# Patient Record
Sex: Female | Born: 1960 | Race: Black or African American | Hispanic: No | Smoking: Never smoker
Health system: Southern US, Community
[De-identification: ages and names within clinical notes are randomized; demographics above are authoritative.]

## PROBLEM LIST (undated history)

## (undated) DIAGNOSIS — D219 Benign neoplasm of connective and other soft tissue, unspecified: Secondary | ICD-10-CM

## (undated) DIAGNOSIS — D649 Anemia, unspecified: Secondary | ICD-10-CM

## (undated) DIAGNOSIS — I10 Essential (primary) hypertension: Secondary | ICD-10-CM

## (undated) DIAGNOSIS — E039 Hypothyroidism, unspecified: Secondary | ICD-10-CM

## (undated) HISTORY — DX: Anemia, unspecified: D64.9

## (undated) HISTORY — PX: TUBAL LIGATION: SHX77

## (undated) HISTORY — DX: Essential (primary) hypertension: I10

---

## 1997-04-15 ENCOUNTER — Other Ambulatory Visit: Admission: RE | Admit: 1997-04-15 | Discharge: 1997-04-15 | Payer: Self-pay | Admitting: *Deleted

## 1998-04-28 ENCOUNTER — Other Ambulatory Visit: Admission: RE | Admit: 1998-04-28 | Discharge: 1998-04-28 | Payer: Self-pay | Admitting: Obstetrics and Gynecology

## 1998-05-11 ENCOUNTER — Ambulatory Visit (HOSPITAL_COMMUNITY): Admission: RE | Admit: 1998-05-11 | Discharge: 1998-05-11 | Payer: Self-pay | Admitting: Obstetrics and Gynecology

## 1998-05-11 ENCOUNTER — Encounter: Payer: Self-pay | Admitting: Obstetrics and Gynecology

## 1999-06-03 ENCOUNTER — Other Ambulatory Visit: Admission: RE | Admit: 1999-06-03 | Discharge: 1999-06-03 | Payer: Self-pay | Admitting: Obstetrics and Gynecology

## 2000-06-26 ENCOUNTER — Other Ambulatory Visit: Admission: RE | Admit: 2000-06-26 | Discharge: 2000-06-26 | Payer: Self-pay | Admitting: *Deleted

## 2000-12-21 ENCOUNTER — Other Ambulatory Visit: Admission: RE | Admit: 2000-12-21 | Discharge: 2000-12-21 | Payer: Self-pay | Admitting: Obstetrics and Gynecology

## 2001-07-12 ENCOUNTER — Other Ambulatory Visit: Admission: RE | Admit: 2001-07-12 | Discharge: 2001-07-12 | Payer: Self-pay | Admitting: Obstetrics and Gynecology

## 2002-07-18 ENCOUNTER — Other Ambulatory Visit: Admission: RE | Admit: 2002-07-18 | Discharge: 2002-07-18 | Payer: Self-pay | Admitting: Obstetrics and Gynecology

## 2004-06-03 ENCOUNTER — Ambulatory Visit (HOSPITAL_COMMUNITY): Admission: RE | Admit: 2004-06-03 | Discharge: 2004-06-03 | Payer: Self-pay | Admitting: Obstetrics and Gynecology

## 2004-06-10 ENCOUNTER — Encounter: Admission: RE | Admit: 2004-06-10 | Discharge: 2004-06-10 | Payer: Self-pay | Admitting: Obstetrics and Gynecology

## 2005-08-15 ENCOUNTER — Encounter: Admission: RE | Admit: 2005-08-15 | Discharge: 2005-08-15 | Payer: Self-pay | Admitting: Obstetrics and Gynecology

## 2006-08-28 ENCOUNTER — Encounter: Admission: RE | Admit: 2006-08-28 | Discharge: 2006-08-28 | Payer: Self-pay | Admitting: Obstetrics and Gynecology

## 2006-08-30 ENCOUNTER — Encounter: Admission: RE | Admit: 2006-08-30 | Discharge: 2006-08-30 | Payer: Self-pay | Admitting: Obstetrics and Gynecology

## 2007-03-02 ENCOUNTER — Encounter: Admission: RE | Admit: 2007-03-02 | Discharge: 2007-03-02 | Payer: Self-pay | Admitting: Obstetrics and Gynecology

## 2007-08-31 ENCOUNTER — Encounter: Admission: RE | Admit: 2007-08-31 | Discharge: 2007-08-31 | Payer: Self-pay | Admitting: Obstetrics and Gynecology

## 2008-10-14 ENCOUNTER — Ambulatory Visit (HOSPITAL_BASED_OUTPATIENT_CLINIC_OR_DEPARTMENT_OTHER): Admission: RE | Admit: 2008-10-14 | Discharge: 2008-10-14 | Payer: Self-pay | Admitting: *Deleted

## 2008-10-14 ENCOUNTER — Ambulatory Visit: Payer: Self-pay | Admitting: Diagnostic Radiology

## 2009-11-02 ENCOUNTER — Ambulatory Visit: Payer: Self-pay | Admitting: Diagnostic Radiology

## 2009-11-02 ENCOUNTER — Ambulatory Visit (HOSPITAL_BASED_OUTPATIENT_CLINIC_OR_DEPARTMENT_OTHER): Admission: RE | Admit: 2009-11-02 | Discharge: 2009-11-02 | Payer: Self-pay | Admitting: Unknown Physician Specialty

## 2010-12-29 ENCOUNTER — Other Ambulatory Visit (HOSPITAL_BASED_OUTPATIENT_CLINIC_OR_DEPARTMENT_OTHER): Payer: Self-pay | Admitting: Unknown Physician Specialty

## 2010-12-29 DIAGNOSIS — Z1231 Encounter for screening mammogram for malignant neoplasm of breast: Secondary | ICD-10-CM

## 2010-12-30 ENCOUNTER — Ambulatory Visit (HOSPITAL_BASED_OUTPATIENT_CLINIC_OR_DEPARTMENT_OTHER)
Admission: RE | Admit: 2010-12-30 | Discharge: 2010-12-30 | Disposition: A | Discharge status: Home / Self Care | Payer: 59 | Source: Ambulatory Visit | Attending: Unknown Physician Specialty | Admitting: Unknown Physician Specialty

## 2010-12-30 DIAGNOSIS — Z1231 Encounter for screening mammogram for malignant neoplasm of breast: Secondary | ICD-10-CM

## 2012-01-18 ENCOUNTER — Other Ambulatory Visit (HOSPITAL_BASED_OUTPATIENT_CLINIC_OR_DEPARTMENT_OTHER): Payer: Self-pay | Admitting: Physician Assistant

## 2012-01-18 DIAGNOSIS — Z1231 Encounter for screening mammogram for malignant neoplasm of breast: Secondary | ICD-10-CM

## 2012-01-24 ENCOUNTER — Ambulatory Visit (HOSPITAL_BASED_OUTPATIENT_CLINIC_OR_DEPARTMENT_OTHER)
Admission: RE | Admit: 2012-01-24 | Discharge: 2012-01-24 | Disposition: A | Discharge status: Home / Self Care | Payer: 59 | Source: Ambulatory Visit | Attending: Physician Assistant | Admitting: Physician Assistant

## 2012-01-24 DIAGNOSIS — Z1231 Encounter for screening mammogram for malignant neoplasm of breast: Secondary | ICD-10-CM | POA: Insufficient documentation

## 2012-02-05 ENCOUNTER — Ambulatory Visit (INDEPENDENT_AMBULATORY_CARE_PROVIDER_SITE_OTHER): Payer: 59 | Admitting: Family Medicine

## 2012-02-05 VITALS — BP 175/96 | HR 118 | Temp 100.1°F | Resp 18 | Ht 67.5 in | Wt 163.0 lb

## 2012-02-05 DIAGNOSIS — J101 Influenza due to other identified influenza virus with other respiratory manifestations: Secondary | ICD-10-CM

## 2012-02-05 DIAGNOSIS — R05 Cough: Secondary | ICD-10-CM

## 2012-02-05 DIAGNOSIS — R52 Pain, unspecified: Secondary | ICD-10-CM

## 2012-02-05 DIAGNOSIS — R059 Cough, unspecified: Secondary | ICD-10-CM

## 2012-02-05 DIAGNOSIS — R509 Fever, unspecified: Secondary | ICD-10-CM

## 2012-02-05 LAB — POCT INFLUENZA A/B
Influenza A, POC: POSITIVE
Influenza B, POC: NEGATIVE

## 2012-02-05 MED ORDER — OSELTAMIVIR PHOSPHATE 75 MG PO CAPS: 75.0000 mg | ORAL_CAPSULE | Freq: Two times a day (BID) | ORAL | Status: DC

## 2012-02-05 MED ORDER — IPRATROPIUM BROMIDE 0.03 % NA SOLN: 2.0000 | Freq: Two times a day (BID) | NASAL | Status: DC

## 2012-02-05 MED ORDER — HYDROCODONE-HOMATROPINE 5-1.5 MG/5ML PO SYRP: 5.0000 mL | ORAL_SOLUTION | Freq: Every evening | ORAL | Status: DC | PRN

## 2012-02-05 MED ORDER — BENZONATATE 100 MG PO CAPS: 200.0000 mg | ORAL_CAPSULE | Freq: Two times a day (BID) | ORAL | Status: AC | PRN

## 2012-02-05 NOTE — Progress Notes (Signed)
  [facility]:  Office Visit  Chief Complaint:  Chief Complaint  Patient presents with  . Fever  . Headache  . Cough  . Sore Throat    HPI: Sonya Weber is a 52 y.o. female who complains of  1 day history of sore throat, fevers, chills, frontal HA, nasal congestion, yellow productive cough, and msk aches. HAs not tried anything for it. Nonsmoker, denies allergies or asthma. Prior contact with flu.   Past Medical History  Diagnosis Date  . Anemia   . Hypertension    Past Surgical History  Procedure Date  . Cesarean section   . Tubal ligation    History   Social History  . Marital Status: Divorced    Spouse Name: N/A    Number of Children: N/A  . Years of Education: N/A   Social History Main Topics  . Smoking status: Never Smoker   . Smokeless tobacco: None  . Alcohol Use: None  . Drug Use: None  . Sexually Active: None   Other Topics Concern  . None   Social History Narrative  . None   No family history on file. No Known Allergies Prior to Admission medications   Medication Sig Start Date End Date Taking? Authorizing Provider  olmesartan-hydrochlorothiazide (BENICAR HCT) 40-25 MG per tablet Take 1 tablet by mouth daily.   Yes Historical Provider, MD     ROS: The patient denies , night sweats, unintentional weight loss, chest pain, palpitations, wheezing, dyspnea on exertion, nausea, vomiting, abdominal pain, dysuria, hematuria, melena, numbness, weakness, or tingling.   All other systems have been reviewed and were otherwise negative with the exception of those mentioned in the HPI and as above.    PHYSICAL EXAM: Filed Vitals:   02/05/12 1022  BP: 175/96  Pulse: 118  Temp: 100.1 F (37.8 C)  Resp: 18   Filed Vitals:   02/05/12 1022  Height: 5' 7.5" (1.715 m)  Weight: 163 lb (73.936 kg)   Body mass index is 25.15 kg/(m^2).  General: Alert, no acute distress HEENT:  Normocephalic, atraumatic, oropharynx patent. + sinus  tenderness, TM nl. Boggy nares, no exudates Cardiovascular:  Regular rate and rhythm, no rubs murmurs or gallops.  No Carotid bruits, radial pulse intact. No pedal edema.  Respiratory: Clear to auscultation bilaterally.  No wheezes, rales, or rhonchi.  No cyanosis, no use of accessory musculature GI: No organomegaly, abdomen is soft and non-tender, positive bowel sounds.  No masses. Skin: No rashes. Neurologic: Facial musculature symmetric. Psychiatric: Patient is appropriate throughout our interaction. Lymphatic: No cervical lymphadenopathy Musculoskeletal: Gait intact.   LABS: Results for orders placed in visit on 02/05/12  POCT INFLUENZA A/B      Component Value Range   Influenza A, POC Positive     Influenza B, POC Negative       EKG/XRAY:   Primary read interpreted by Dr. Conley Rolls at [facility].   ASSESSMENT/PLAN: Encounter Diagnoses  Name Primary?  . Fever   . Cough   . Body aches   . Influenza A Yes   Rx Tamiflu, Atrovent NS, Hydromet syrup and Tessalon Perles Flu precautions F/u prn    Betzayda Braxton PHUONG, DO 02/05/2012 12:37 PM

## 2012-02-05 NOTE — Patient Instructions (Signed)

## 2013-06-21 ENCOUNTER — Other Ambulatory Visit: Payer: Self-pay

## 2013-06-21 ENCOUNTER — Other Ambulatory Visit (HOSPITAL_BASED_OUTPATIENT_CLINIC_OR_DEPARTMENT_OTHER): Payer: Self-pay | Admitting: Internal Medicine

## 2013-06-21 DIAGNOSIS — Z1231 Encounter for screening mammogram for malignant neoplasm of breast: Secondary | ICD-10-CM

## 2013-06-24 ENCOUNTER — Ambulatory Visit
Admission: RE | Admit: 2013-06-24 | Discharge: 2013-06-24 | Disposition: A | Discharge status: Home / Self Care | Payer: 59 | Source: Ambulatory Visit

## 2013-06-24 ENCOUNTER — Encounter (INDEPENDENT_AMBULATORY_CARE_PROVIDER_SITE_OTHER): Payer: Self-pay

## 2013-06-24 DIAGNOSIS — Z1231 Encounter for screening mammogram for malignant neoplasm of breast: Secondary | ICD-10-CM

## 2013-11-05 ENCOUNTER — Other Ambulatory Visit: Payer: Self-pay | Admitting: Obstetrics & Gynecology

## 2013-11-05 DIAGNOSIS — D259 Leiomyoma of uterus, unspecified: Secondary | ICD-10-CM

## 2013-11-12 ENCOUNTER — Ambulatory Visit
Admission: RE | Admit: 2013-11-12 | Discharge: 2013-11-12 | Disposition: A | Discharge status: Home / Self Care | Payer: 59 | Source: Ambulatory Visit | Attending: Obstetrics & Gynecology | Admitting: Obstetrics & Gynecology

## 2013-11-12 ENCOUNTER — Other Ambulatory Visit: Payer: Self-pay | Admitting: Radiology

## 2013-11-12 DIAGNOSIS — D259 Leiomyoma of uterus, unspecified: Secondary | ICD-10-CM

## 2013-11-12 HISTORY — DX: Benign neoplasm of connective and other soft tissue, unspecified: D21.9

## 2013-11-12 HISTORY — DX: Hypothyroidism, unspecified: E03.9

## 2013-11-12 LAB — CREATININE WITH EST GFR
Creat: 0.8 mg/dL (ref 0.50–1.10)
GFR, Est Non African American: 86 mL/min

## 2013-11-12 LAB — BUN: BUN: 14 mg/dL (ref 6–23)

## 2013-11-12 NOTE — Consult Note (Signed)
Chief Complaint: Chief Complaint  Patient presents with  . Advice Only    Consult for [place]      Referring Physician(s): Nelva Bush M  History of Present Illness: Sonya Weber is a 53 y.o. female presenting with severe menorrhagia as a result of her symptomatic uterine fibroids.  The patient reports that she has had several years of heavy bleeding during her menstrual cycles, which has been worsening over the past 5-6 months. This was the reason she sought help from her gynecologist. She reports that she has developed anemia which required treatment from the hematology service as well. She has needed to undergo blood transfusions given her anemia.   She reports at least 5-6 days of bleeding during her cycles, with at least 4-5 days of heavy bleeding and clots using both tampons and pads. She reports that the bleeding will often wake her during the night, during the time of her cycles. She denies any abnormal bleeding between her cycles. She denies any painful bleeding. The patient also reports that she has been experiencing more frequency and urgency, which are both-type symptoms. She denies any pain with menstruation, and denies dyspareunia..  The patient has had 4 pregnancies, with 3 children resulting. She has no desire for further pregnancies. In fact, she has had bilateral tubal ligation. Currently the patient has an IUD in place.  The patient has no endometrial biopsy at this time. A previous Pap-smear shows atypical squamous cells, completed 03/13/2013.   Past Medical History  Diagnosis Date  . Anemia   . Hypertension   . Fibroids   . Hypothyroidism     Past Surgical History  Procedure Laterality Date  . Cesarean section    . Tubal ligation      Allergies: Review of patient's allergies indicates no known allergies.  Medications: Prior to Admission medications   Medication Sig Start Date End Date Taking? Authorizing Provider  amLODipine (NORVASC) 5 MG tablet Take 5  mg by mouth daily.   Yes Historical Provider, MD  folic acid (FOLVITE) 1 MG tablet Take 1 mg by mouth daily.   Yes Historical Provider, MD  levonorgestrel (MIRENA) 20 MCG/24HR IUD 1 each by Intrauterine route once.   Yes Historical Provider, MD  levothyroxine (SYNTHROID, LEVOTHROID) 25 MCG tablet Take 25 mcg by mouth daily before breakfast.   Yes Historical Provider, MD  olmesartan-hydrochlorothiazide (BENICAR HCT) 40-25 MG per tablet Take 1 tablet by mouth daily.   Yes Historical Provider, MD  HYDROcodone-homatropine (HYCODAN) 5-1.5 MG/5ML syrup Take 5 mLs by mouth at bedtime as needed for cough. 02/05/12   Thao P Le, DO  ipratropium (ATROVENT) 0.03 % nasal spray Place 2 sprays into the nose every 12 (twelve) hours. 02/05/12   Thao P Le, DO  oseltamivir (TAMIFLU) 75 MG capsule Take 1 capsule (75 mg total) by mouth 2 (two) times daily. 02/05/12   Thao P Le, DO    No family history on file.  History   Social History  . Marital Status: Divorced    Spouse Name: N/A    Number of Children: N/A  . Years of Education: N/A   Social History Main Topics  . Smoking status: Never Smoker   . Smokeless tobacco: Not on file  . Alcohol Use: Not on file  . Drug Use: Not on file  . Sexual Activity: Not on file   Other Topics Concern  . Not on file   Social History Narrative    ECOG Status: 1 - Symptomatic  but completely ambulatory  Review of Systems: A 12 point ROS discussed and pertinent positives are indicated in the HPI above.  All other systems are negative.  Review of Systems  Vital Signs: BP 145/87 mmHg  Pulse 87  Temp(Src) 98.4 F (36.9 C) (Oral)  Resp 15  Ht 5\' 7"  (1.702 m)  Wt 174 lb (78.926 kg)  BMI 27.25 kg/m2  SpO2 100%  LMP 10/12/2013 (Approximate)     Physical Exam Atraumatic, normocephalic. Alert and oriented to person place and time. Appropriate responses and insight. Pupils equal round reactive to light bilaterally. No scleral icterus or scleral injection. Mucous  membranes moist and pink. Neck soft supple without thyroid enlargement or adenopathy. Lungs clear to auscultation bilaterally. Regular rate and rhythm with no third heart sounds appreciated. Bowel sounds are present. No pulsatile mass of the abdomen. No mass appreciated within the upper pelvis. No organomegaly appreciated. Genitourinary exam deferred. Normal palpable pulses at the bilateral radial segments. Palpable bilateral common femoral pulses. No edema. No rash appreciated. Normal range of motion of extremities. Normal sensation intact.  Imaging: The patient has had pelvic US completed, though the results are not visible to me at this time.  The given report states multiple fibroids.   Labs:  CBC: No results for input(s): WBC, HGB, HCT, PLT in the last 8760 hours.  COAGS: No results for input(s): INR, APTT in the last 8760 hours.  BMP: No results for input(s): NA, K, CL, CO2, GLUCOSE, BUN, CALCIUM, CREATININE, GFRNONAA, GFRAA in the last 8760 hours.  Invalid input(s): CMP  LIVER FUNCTION TESTS: No results for input(s): BILITOT, AST, ALT, ALKPHOS, PROT, ALBUMIN in the last 8760 hours.   Assessment and Plan:  Our patient is a 53 year old female with symptomatic uterine fibroids, presenting predominantly with menorrhagia, which is contributing to blood loss anemia. The patient does report more mild bulk type symptoms,  specifically urinary frequency and urgency.  I had a long discussion with [name], in which I discussed with her the pathology, pathophysiology, and natural history of uterine fibroids, as well as the potential treatment options. The treatment options which I discussed with her included both surgical options as well as uterine fibroid embolization. In addition, I did discuss with her medical/hormonal treatments, as well as MRI directed high-frequency ultrasound.  [name] has 3 children, and reports that she has no desire for future children. In addition, she  does prefer a less invasive procedure for her potential fibroid treatment. Thus, she may be an excellent candidate for uterine fibroid embolization.   Given her interest, I discussed with her the risks and benefits of uterine fibroid embolus, stating that the efficacy is similar to that of surgery in the short-term, with approximately 90% of patients satisfied with symptom resolution. The specific risks of the procedure that I discussed included: Access related issues such as bleeding, arterial injury, infection, as well as those of specific to the uterine fibroid embolization, including: Postembolization syndrome, short term discomfort/pain, infection/endometritis, tissue/fluid passage, nontarget embolization including early menopause/amenorrhea, blood clot formation, and need for hysterectomy.  Given her interest, we will pursue MRI of the pelvis to evaluate the burden of fibroids and for any potential pedunculated fibroids. The patient does wish to proceed with the embolization should she remain a candidate.    Thank you for this interesting consult.  I greatly enjoyed meeting THERESE ROCCO and look forward to participating in her care.     I spent a total of 60 minutes face  to face in clinical consultation, greater than 50% of which was counseling/coordinating care for her symptomatic uterine fibroids, and potential uterine fibroid embolization.  SignedJohny Shears 11/12/2013, 4:08 PM

## 2013-11-14 ENCOUNTER — Other Ambulatory Visit: Payer: Self-pay | Admitting: Interventional Radiology

## 2013-11-14 ENCOUNTER — Telehealth: Payer: Self-pay | Admitting: Emergency Medicine

## 2013-11-14 DIAGNOSIS — D259 Leiomyoma of uterus, unspecified: Secondary | ICD-10-CM

## 2013-11-14 DIAGNOSIS — N920 Excessive and frequent menstruation with regular cycle: Secondary | ICD-10-CM

## 2013-11-14 NOTE — Telephone Encounter (Signed)
LMOVM FOR LISA W/ DR [facility] OFFICE TO CALL PT TO SET HER UP FOR EBX.  WILL NEED RESULTS PRIOR TO SCHEDULING UFE.

## 2013-11-21 ENCOUNTER — Ambulatory Visit (HOSPITAL_COMMUNITY)
Admission: RE | Admit: 2013-11-21 | Discharge: 2013-11-21 | Disposition: A | Discharge status: Home / Self Care | Payer: 59 | Source: Ambulatory Visit | Attending: Interventional Radiology | Admitting: Interventional Radiology

## 2013-11-21 DIAGNOSIS — D259 Leiomyoma of uterus, unspecified: Secondary | ICD-10-CM

## 2013-11-21 DIAGNOSIS — N852 Hypertrophy of uterus: Secondary | ICD-10-CM | POA: Diagnosis not present

## 2013-11-21 DIAGNOSIS — M8538 Osteitis condensans, other site: Secondary | ICD-10-CM | POA: Diagnosis not present

## 2013-11-21 DIAGNOSIS — N920 Excessive and frequent menstruation with regular cycle: Secondary | ICD-10-CM | POA: Diagnosis present

## 2013-11-21 DIAGNOSIS — D251 Intramural leiomyoma of uterus: Secondary | ICD-10-CM | POA: Insufficient documentation

## 2013-11-21 DIAGNOSIS — M479 Spondylosis, unspecified: Secondary | ICD-10-CM | POA: Diagnosis not present

## 2013-11-21 MED ORDER — GADOBENATE DIMEGLUMINE 529 MG/ML IV SOLN
16.0000 mL | Freq: Once | INTRAVENOUS | Status: AC | PRN
  Administered 2013-11-21: 16 mL via INTRAVENOUS

## 2013-12-04 ENCOUNTER — Telehealth: Payer: Self-pay | Admitting: Radiology

## 2013-12-04 NOTE — Telephone Encounter (Signed)
Left message requesting patient call re:  [place] scheduling.  Xuan Mateus Riki Rusk, RN 12/04/2013 10:52 AM

## 2013-12-05 ENCOUNTER — Telehealth: Payer: Self-pay | Admitting: Emergency Medicine

## 2013-12-05 NOTE — Telephone Encounter (Signed)
PT RETURNED OUR CALL FROM YESTERDAY SHE IS READY TO SET UP [place] AND WANTS DONE PRIOR TO END OF YEAR. ASKED ABOUT HER IUD- [facility] COULD NOT LOCATE IT AND THEY DID AN XRAY AND [place] AND SEEM TO THINK IT CAME OUT ON ITS OWN!

## 2013-12-06 ENCOUNTER — Other Ambulatory Visit: Payer: Self-pay | Admitting: Interventional Radiology

## 2013-12-06 DIAGNOSIS — D251 Intramural leiomyoma of uterus: Secondary | ICD-10-CM

## 2013-12-19 ENCOUNTER — Telehealth: Payer: Self-pay | Admitting: Emergency Medicine

## 2013-12-19 NOTE — Telephone Encounter (Signed)
LM FOR PT TO LET HER KNOW THAT WE HAVE THE [employer] AUTH[employer] FOR HER [place] PROCEDURE SCHEDULED FOR 01-01-14 ALSO MENTIONED TO  HER IF SHE HAS FMLA FORMS FROM HER JOB TO GET THOSE TO ME ASAP!!!

## 2013-12-31 ENCOUNTER — Other Ambulatory Visit: Payer: Self-pay | Admitting: Radiology

## 2014-01-01 ENCOUNTER — Encounter (HOSPITAL_COMMUNITY): Payer: Self-pay

## 2014-01-01 ENCOUNTER — Ambulatory Visit (HOSPITAL_COMMUNITY)
Admission: RE | Admit: 2014-01-01 | Discharge: 2014-01-01 | Disposition: A | Discharge status: Home / Self Care | Payer: 59 | Source: Ambulatory Visit | Attending: Interventional Radiology | Admitting: Interventional Radiology

## 2014-01-01 ENCOUNTER — Observation Stay (HOSPITAL_COMMUNITY)
Admission: RE | Admit: 2014-01-01 | Discharge: 2014-01-02 | Disposition: A | Discharge status: Home / Self Care | Payer: 59 | Source: Ambulatory Visit | Attending: Interventional Radiology | Admitting: Interventional Radiology

## 2014-01-01 DIAGNOSIS — N920 Excessive and frequent menstruation with regular cycle: Secondary | ICD-10-CM | POA: Insufficient documentation

## 2014-01-01 DIAGNOSIS — D251 Intramural leiomyoma of uterus: Secondary | ICD-10-CM

## 2014-01-01 DIAGNOSIS — D649 Anemia, unspecified: Secondary | ICD-10-CM | POA: Diagnosis not present

## 2014-01-01 DIAGNOSIS — E039 Hypothyroidism, unspecified: Secondary | ICD-10-CM | POA: Diagnosis not present

## 2014-01-01 DIAGNOSIS — I10 Essential (primary) hypertension: Secondary | ICD-10-CM | POA: Insufficient documentation

## 2014-01-01 DIAGNOSIS — Z9851 Tubal ligation status: Secondary | ICD-10-CM | POA: Insufficient documentation

## 2014-01-01 DIAGNOSIS — D219 Benign neoplasm of connective and other soft tissue, unspecified: Secondary | ICD-10-CM | POA: Diagnosis present

## 2014-01-01 LAB — CBC
HCT: 29 % — ABNORMAL LOW (ref 36.0–46.0)
HEMOGLOBIN: 8.2 g/dL — AB (ref 12.0–15.0)
MCH: 21 pg — ABNORMAL LOW (ref 26.0–34.0)
MCHC: 28.3 g/dL — ABNORMAL LOW (ref 30.0–36.0)
MCV: 74.4 fL — AB (ref 78.0–100.0)
Platelets: 241 10*3/uL (ref 150–400)
RBC: 3.9 MIL/uL (ref 3.87–5.11)
RDW: 20.9 % — AB (ref 11.5–15.5)
WBC: 6.1 10*3/uL (ref 4.0–10.5)

## 2014-01-01 LAB — BASIC METABOLIC PANEL
ANION GAP: 5 (ref 5–15)
BUN: 11 mg/dL (ref 6–23)
CHLORIDE: 104 meq/L (ref 96–112)
CO2: 25 mmol/L (ref 19–32)
Calcium: 8.9 mg/dL (ref 8.4–10.5)
Creatinine, Ser: 0.68 mg/dL (ref 0.50–1.10)
GFR calc Af Amer: >90 mL/min (ref >90)
GFR calc non Af Amer: >90 mL/min (ref >90)
Glucose, Bld: 94 mg/dL (ref 70–99)
POTASSIUM: 3.4 mmol/L — AB (ref 3.5–5.1)
Sodium: 134 mmol/L — ABNORMAL LOW (ref 135–145)

## 2014-01-01 LAB — APTT: aPTT: 27 seconds (ref 24–37)

## 2014-01-01 LAB — HCG, SERUM, QUALITATIVE: PREG SERUM: NEGATIVE

## 2014-01-01 LAB — PROTIME-INR
INR: 1.05 (ref 0.00–1.49)
Prothrombin Time: 13.8 seconds (ref 11.6–15.2)

## 2014-01-01 MED ORDER — DIPHENHYDRAMINE HCL 50 MG/ML IJ SOLN: 12.5000 mg | Freq: Four times a day (QID) | INTRAMUSCULAR | Status: DC | PRN

## 2014-01-01 MED ORDER — LEVOTHYROXINE SODIUM 25 MCG PO TABS
25.0000 ug | ORAL_TABLET | Freq: Every day | ORAL | Status: DC
  Administered 2014-01-02: 25 ug via ORAL
  Filled 2014-01-01 (×2): qty 1

## 2014-01-01 MED ORDER — AMLODIPINE BESYLATE 5 MG PO TABS
5.0000 mg | ORAL_TABLET | Freq: Every day | ORAL | Status: DC
  Administered 2014-01-02: 5 mg via ORAL
  Filled 2014-01-01 (×2): qty 1

## 2014-01-01 MED ORDER — IRBESARTAN 300 MG PO TABS
300.0000 mg | ORAL_TABLET | Freq: Every day | ORAL | Status: DC
  Administered 2014-01-02: 300 mg via ORAL
  Filled 2014-01-01: qty 1

## 2014-01-01 MED ORDER — HYDROCHLOROTHIAZIDE 25 MG PO TABS
25.0000 mg | ORAL_TABLET | Freq: Every day | ORAL | Status: DC
  Administered 2014-01-01: 25 mg via ORAL
  Filled 2014-01-01 (×2): qty 1

## 2014-01-01 MED ORDER — KETOROLAC TROMETHAMINE 30 MG/ML IJ SOLN: 30.0000 mg | Freq: Once | INTRAMUSCULAR | Status: DC

## 2014-01-01 MED ORDER — SODIUM CHLORIDE 0.9 % IV SOLN
INTRAVENOUS | Status: AC
  Administered 2014-01-01 (×2): via INTRAVENOUS

## 2014-01-01 MED ORDER — FENTANYL CITRATE 0.05 MG/ML IJ SOLN
INTRAMUSCULAR | Status: AC
  Filled 2014-01-01: qty 6

## 2014-01-01 MED ORDER — FENTANYL CITRATE 0.05 MG/ML IJ SOLN
INTRAMUSCULAR | Status: AC | PRN
  Administered 2014-01-01 (×2): 50 ug via INTRAVENOUS
  Administered 2014-01-01: 100 ug via INTRAVENOUS

## 2014-01-01 MED ORDER — PROMETHAZINE HCL 25 MG PO TABS: 25.0000 mg | ORAL_TABLET | Freq: Three times a day (TID) | ORAL | Status: DC | PRN

## 2014-01-01 MED ORDER — ONDANSETRON HCL 4 MG/2ML IJ SOLN: 4.0000 mg | Freq: Four times a day (QID) | INTRAMUSCULAR | Status: DC | PRN

## 2014-01-01 MED ORDER — KETOROLAC TROMETHAMINE 60 MG/2ML IM SOLN
60.0000 mg | Freq: Once | INTRAMUSCULAR | Status: AC
  Administered 2014-01-01: 60 mg via INTRAMUSCULAR
  Filled 2014-01-01: qty 2

## 2014-01-01 MED ORDER — MIDAZOLAM HCL 2 MG/2ML IJ SOLN
INTRAMUSCULAR | Status: AC
  Filled 2014-01-01: qty 6

## 2014-01-01 MED ORDER — OLMESARTAN MEDOXOMIL-HCTZ 40-25 MG PO TABS: 1.0000 | ORAL_TABLET | Freq: Every day | ORAL | Status: DC

## 2014-01-01 MED ORDER — NALOXONE HCL 0.4 MG/ML IJ SOLN: 0.4000 mg | INTRAMUSCULAR | Status: DC | PRN

## 2014-01-01 MED ORDER — IRBESARTAN 300 MG PO TABS
300.0000 mg | ORAL_TABLET | Freq: Every day | ORAL | Status: DC
  Administered 2014-01-01: 300 mg via ORAL
  Filled 2014-01-01 (×2): qty 1

## 2014-01-01 MED ORDER — ONDANSETRON 8 MG/NS 50 ML IVPB
8.0000 mg | Freq: Once | INTRAVENOUS | Status: AC
  Administered 2014-01-01: 8 mg via INTRAVENOUS
  Filled 2014-01-01: qty 8

## 2014-01-01 MED ORDER — DOCUSATE SODIUM 100 MG PO CAPS
100.0000 mg | ORAL_CAPSULE | Freq: Two times a day (BID) | ORAL | Status: DC
  Administered 2014-01-01 – 2014-01-02 (×2): 100 mg via ORAL
  Filled 2014-01-01 (×3): qty 1

## 2014-01-01 MED ORDER — SODIUM CHLORIDE 0.9 % IJ SOLN
9.0000 mL | INTRAMUSCULAR | Status: DC | PRN
Start: 2014-01-01 — End: 2014-01-01

## 2014-01-01 MED ORDER — LIDOCAINE HCL 1 % IJ SOLN
INTRAMUSCULAR | Status: AC
  Filled 2014-01-01: qty 20

## 2014-01-01 MED ORDER — CEFAZOLIN SODIUM-DEXTROSE 2-3 GM-% IV SOLR
2.0000 g | Freq: Once | INTRAVENOUS | Status: AC
  Administered 2014-01-01: 2 g via INTRAVENOUS

## 2014-01-01 MED ORDER — HYDROMORPHONE 0.3 MG/ML IV SOLN
INTRAVENOUS | Status: DC
  Administered 2014-01-01: 1.5 mg via INTRAVENOUS
  Administered 2014-01-02: 0.9 mg via INTRAVENOUS
  Administered 2014-01-02: 0.3 mg via INTRAVENOUS
  Administered 2014-01-02: 1.5 mg via INTRAVENOUS
  Administered 2014-01-02: 04:00:00 via INTRAVENOUS
  Filled 2014-01-01: qty 25

## 2014-01-01 MED ORDER — SODIUM CHLORIDE 0.9 % IJ SOLN: 3.0000 mL | INTRAMUSCULAR | Status: DC | PRN

## 2014-01-01 MED ORDER — HYDROMORPHONE 0.3 MG/ML IV SOLN
INTRAVENOUS | Status: DC
  Administered 2014-01-01: 11:00:00 via INTRAVENOUS
  Administered 2014-01-01: 2.1 mg via INTRAVENOUS

## 2014-01-01 MED ORDER — HYDROMORPHONE HCL 2 MG/ML IJ SOLN
INTRAMUSCULAR | Status: AC
  Filled 2014-01-01: qty 1

## 2014-01-01 MED ORDER — KETOROLAC TROMETHAMINE 30 MG/ML IJ SOLN
30.0000 mg | Freq: Four times a day (QID) | INTRAMUSCULAR | Status: DC
  Administered 2014-01-01 – 2014-01-02 (×4): 30 mg via INTRAVENOUS
  Filled 2014-01-01 (×6): qty 1

## 2014-01-01 MED ORDER — CEFAZOLIN SODIUM-DEXTROSE 2-3 GM-% IV SOLR
INTRAVENOUS | Status: AC
  Filled 2014-01-01: qty 50

## 2014-01-01 MED ORDER — ONDANSETRON HCL 4 MG/2ML IJ SOLN
4.0000 mg | Freq: Four times a day (QID) | INTRAMUSCULAR | Status: DC | PRN
Start: 2014-01-01 — End: 2014-01-01

## 2014-01-01 MED ORDER — SODIUM CHLORIDE 0.9 % IV SOLN
INTRAVENOUS | Status: DC
  Administered 2014-01-01: 08:00:00 via INTRAVENOUS

## 2014-01-01 MED ORDER — HYDROMORPHONE 0.3 MG/ML IV SOLN
INTRAVENOUS | Status: AC
  Filled 2014-01-01: qty 25

## 2014-01-01 MED ORDER — SODIUM CHLORIDE 0.9 % IV SOLN: INTRAVENOUS | Status: DC

## 2014-01-01 MED ORDER — SODIUM CHLORIDE 0.9 % IV SOLN: 250.0000 mL | INTRAVENOUS | Status: DC | PRN

## 2014-01-01 MED ORDER — SODIUM CHLORIDE 0.9 % IJ SOLN: 9.0000 mL | INTRAMUSCULAR | Status: DC | PRN

## 2014-01-01 MED ORDER — HYDROCHLOROTHIAZIDE 25 MG PO TABS
25.0000 mg | ORAL_TABLET | Freq: Every day | ORAL | Status: DC
  Administered 2014-01-02: 25 mg via ORAL
  Filled 2014-01-01: qty 1

## 2014-01-01 MED ORDER — DIPHENHYDRAMINE HCL 12.5 MG/5ML PO ELIX
12.5000 mg | ORAL_SOLUTION | Freq: Four times a day (QID) | ORAL | Status: DC | PRN
  Filled 2014-01-01: qty 5

## 2014-01-01 MED ORDER — IPRATROPIUM BROMIDE 0.03 % NA SOLN: 2.0000 | Freq: Two times a day (BID) | NASAL | Status: DC

## 2014-01-01 MED ORDER — AMLODIPINE BESYLATE 5 MG PO TABS
5.0000 mg | ORAL_TABLET | Freq: Every day | ORAL | Status: DC
  Filled 2014-01-01: qty 1

## 2014-01-01 MED ORDER — SODIUM CHLORIDE 0.9 % IJ SOLN: 3.0000 mL | Freq: Two times a day (BID) | INTRAMUSCULAR | Status: DC

## 2014-01-01 MED ORDER — DIPHENHYDRAMINE HCL 12.5 MG/5ML PO ELIX: 12.5000 mg | ORAL_SOLUTION | Freq: Four times a day (QID) | ORAL | Status: DC | PRN

## 2014-01-01 MED ORDER — IPRATROPIUM BROMIDE 0.03 % NA SOLN
2.0000 | Freq: Two times a day (BID) | NASAL | Status: DC
  Filled 2014-01-01: qty 30

## 2014-01-01 MED ORDER — PROMETHAZINE HCL 25 MG RE SUPP: 25.0000 mg | Freq: Three times a day (TID) | RECTAL | Status: DC | PRN

## 2014-01-01 MED ORDER — IOHEXOL 300 MG/ML  SOLN
80.0000 mL | Freq: Once | INTRAMUSCULAR | Status: AC | PRN
  Administered 2014-01-01: 65 mL via INTRA_ARTERIAL

## 2014-01-01 MED ORDER — LEVOTHYROXINE SODIUM 25 MCG PO TABS
25.0000 ug | ORAL_TABLET | Freq: Every day | ORAL | Status: DC
  Filled 2014-01-01: qty 1

## 2014-01-01 MED ORDER — MIDAZOLAM HCL 2 MG/2ML IJ SOLN
INTRAMUSCULAR | Status: AC | PRN
  Administered 2014-01-01: 2 mg via INTRAVENOUS
  Administered 2014-01-01 (×2): 1 mg via INTRAVENOUS

## 2014-01-01 MED ORDER — BUPIVACAINE HCL (PF) 0.25 % IJ SOLN
INTRAMUSCULAR | Status: AC
  Filled 2014-01-01: qty 30

## 2014-01-01 NOTE — Procedures (Signed)
Interventional Radiology Procedure Note  Procedure: Bilateral uterine artery embolization for symptomatic uterine fibroids.  Anterior approach, superior hypogastric nerve block. Rt CFA closure device. Complications: No immediate Recommendations:  - bedrest 3 hours.  - advance diet - maintain foley until pm today, if patient wants to ambulate, may remove.  Otherwise, dc 6am 01/02/2014 - PCA -Q8 hr 30mg  IV toradol - anti-nausea - Ok to shower tomorrow - Do not submerge for 7 days   Signed,  Dulcy Fanny. Earleen Newport, DO

## 2014-01-01 NOTE — H&P (Signed)
Chief Complaint: Menorrhagia, uterine fibroids  Referring Physician(s): Dr. Nelva Bush  History of Present Illness: Sonya Weber is a 53 y.o. female with history of symptomatic uterine fibroids/anemia who presents today following IR consultation for elective bilateral uterine artery embolization.  Past Medical History  Diagnosis Date  . Anemia   . Hypertension   . Fibroids   . Hypothyroidism     Past Surgical History  Procedure Laterality Date  . Cesarean section    . Tubal ligation      Allergies: Review of patient's allergies indicates no known allergies.  Medications: Prior to Admission medications   Medication Sig Start Date End Date Taking? Authorizing Provider  amLODipine (NORVASC) 5 MG tablet Take 5 mg by mouth daily with breakfast.     Historical Provider, MD  HYDROcodone-homatropine (HYCODAN) 5-1.5 MG/5ML syrup Take 5 mLs by mouth at bedtime as needed for cough. Patient not taking: Reported on 01/01/2014 02/05/12   Thao P Le, DO  ipratropium (ATROVENT) 0.03 % nasal spray Place 2 sprays into the nose every 12 (twelve) hours. Patient not taking: Reported on 01/01/2014 02/05/12   Thao P Le, DO  levothyroxine (SYNTHROID, LEVOTHROID) 25 MCG tablet Take 25 mcg by mouth daily before breakfast.    Historical Provider, MD  olmesartan-hydrochlorothiazide (BENICAR HCT) 40-25 MG per tablet Take 1 tablet by mouth daily with breakfast.     Historical Provider, MD  oseltamivir (TAMIFLU) 75 MG capsule Take 1 capsule (75 mg total) by mouth 2 (two) times daily. Patient not taking: Reported on 01/01/2014 02/05/12   Thao P Le, DO    No family history on file.  History   Social History  . Marital Status: Divorced    Spouse Name: N/A    Number of Children: N/A  . Years of Education: N/A   Social History Main Topics  . Smoking status: Never Smoker   . Smokeless tobacco: Not on file  . Alcohol Use: Not on file  . Drug Use: Not on file  . Sexual Activity: Not on file    Other Topics Concern  . Not on file   Social History Narrative        Review of Systems  Constitutional: Negative for fever and chills.  Respiratory: Negative for cough and shortness of breath.   Cardiovascular: Negative for chest pain.  Gastrointestinal: Negative for nausea, vomiting, abdominal pain and blood in stool.  Genitourinary: Negative for dysuria and hematuria.       Hx menorrhagia, intermittent cramping/urinary urgency/frequency  Musculoskeletal: Negative for back pain.  Neurological: Negative for headaches.  Hematological: Does not bruise/bleed easily.    Vital Signs: LMP 12/10/2013 (Exact Date)  BP 161/96  HR 77  R 16  TEMP 98.3  O2 SATS 100% RA  Physical Exam  Constitutional: She is oriented to person, place, and time. She appears well-developed and well-nourished.  Cardiovascular: Normal rate and regular rhythm.   Pulmonary/Chest: Effort normal and breath sounds normal.  Abdominal: Soft. Bowel sounds are normal. There is no tenderness.  Musculoskeletal: Normal range of motion. She exhibits no edema.  Neurological: She is alert and oriented to person, place, and time.    Imaging: No results found.  Labs:  CBC:  Recent Labs  01/01/14 0754  WBC 6.1  HGB 8.2*  HCT 29.0*  PLT 241    COAGS:  Recent Labs  01/01/14 0754  INR 1.05  APTT 27    BMP:  Recent Labs  11/12/13 1458  BUN 14  CREATININE 0.80  GFRNONAA 86  GFRAA >89    LIVER FUNCTION TESTS: No results for input(s): BILITOT, AST, ALT, ALKPHOS, PROT, ALBUMIN in the last 8760 hours.  TUMOR MARKERS: No results for input(s): AFPTM, CEA, CA199, CHROMGRNA in the last 8760 hours.  Assessment and Plan: Sonya Weber is a 53 y.o. female with history of symptomatic uterine fibroids/anemia who presents today following IR consultation for elective bilateral uterine artery embolization. Details/risks of procedure d/w pt/husband with their understanding and consent. Following procedure pt  will be admitted for overnight observation for pain control.       Signed: Autumn Messing 01/01/2014, 8:45 AM

## 2014-01-01 NOTE — Progress Notes (Signed)
  Subjective:  Pt with intermittent mild pelvic cramping; denies N/V/dyspnea/CP  Objective: Vital signs in last 24 hours: Temp:  [97.6 F (36.4 C)-98.3 F (36.8 C)] 98.2 F (36.8 C) (12/30 1440) Pulse Rate:  [61-78] 67 (12/30 1440) Resp:  [10-18] 16 (12/30 1440) BP: (122-161)/(72-96) 145/80 mmHg (12/30 1440) SpO2:  [94 %-100 %] 100 % (12/30 1440) Last BM Date: 12/31/13  Intake/Output from previous day:   Intake/Output this shift: Total I/O In: 240 [P.O.:240] Out: 900 [Urine:900]   abd soft,+BS, NT; rt CFA puncture site clean and dry,NT, no hematoma; intact distal pulses  Lab Results:   Recent Labs  01/01/14 0754  WBC 6.1  HGB 8.2*  HCT 29.0*  PLT 241   BMET  Recent Labs  01/01/14 0754  NA 134*  K 3.4*  CL 104  CO2 25  GLUCOSE 94  BUN 11  CREATININE 0.68  CALCIUM 8.9   PT/INR  Recent Labs  01/01/14 0754  LABPROT 13.8  INR 1.05   ABG No results for input(s): PHART, HCO3 in the last 72 hours.  Invalid input(s): PCO2, PO2  Studies/Results: No results found.  Anti-infectives: Anti-infectives    None      Assessment/Plan: s/p bilat [place], /sup hypogastric nerve  block for symptomatic uterine fibroids 12/30; stable; for overnight obs for pain control; hydrate; recheck in am; f/u with Dr. Earleen Newport in 2-3 weeks in [place] [facility]  LOS: 0 days    Sandon Yoho,D [facility] 01/01/2014

## 2014-01-02 ENCOUNTER — Other Ambulatory Visit: Payer: Self-pay | Admitting: Radiology

## 2014-01-02 DIAGNOSIS — N920 Excessive and frequent menstruation with regular cycle: Secondary | ICD-10-CM

## 2014-01-02 DIAGNOSIS — D259 Leiomyoma of uterus, unspecified: Secondary | ICD-10-CM

## 2014-01-02 DIAGNOSIS — D251 Intramural leiomyoma of uterus: Secondary | ICD-10-CM | POA: Diagnosis not present

## 2014-01-02 MED ORDER — HYDROCODONE-ACETAMINOPHEN 5-325 MG PO TABS: 1.0000 | ORAL_TABLET | ORAL | Status: DC | PRN

## 2014-01-02 NOTE — Discharge Instructions (Signed)
Uterine Artery Embolization for Fibroids, Care After Refer to this sheet in the next few weeks. These instructions provide you with information on caring for yourself after your procedure. Your health care provider may also give you more specific instructions. Your treatment has been planned according to current medical practices, but problems sometimes occur. Call your health care provider if you have any problems or questions after your procedure. WHAT TO EXPECT AFTER THE PROCEDURE After your procedure, it is typical to have cramping in the pelvis. You will be given pain medicine to control it. HOME CARE INSTRUCTIONS  Only take over-the-counter or prescription medicines for pain, discomfort, or fever as directed by your health care provider.  Do not take aspirin. It can cause bleeding.  Follow your health care provider's advice regarding medicines given to you, diet, activity, and when to begin sexual activity.  See your health care provider for follow-up care as directed. SEEK MEDICAL CARE IF:  You have a fever.  You have redness, swelling, and pain around your incision site.  You have pus draining from your incision.  You have a rash. SEEK IMMEDIATE MEDICAL CARE IF:  You have bleeding from your incision site.  You have difficulty breathing.  You have chest pain.  You have severe abdominal pain.  You have leg pain.  You become dizzy and faint. Document Released: 10/10/2012 Document Reviewed: 10/10/2012 [facility] Patient Information 2015 [place], [place]. This information is not intended to replace advice given to you by your health care provider. Make sure you discuss any questions you have with your health care provider.

## 2014-01-02 NOTE — Discharge Summary (Signed)
Physician Discharge Summary  Patient ID: Sonya Weber MRN: [id number] DOB/AGE: July 30, 1960 53 y.o.  Admit date: 01/01/2014 Discharge date: 01/02/2014  Admission Diagnoses: Symptomatic uterine fibroids  Discharge Diagnoses: Symptomatic uterine fibroids, status post successful bilateral uterine artery embolization and fluoroscopic guided superior hypogastric nerve block  on 01/01/2014 Active Problems:   Menorrhagia with regular cycle   Fibroids, intramural   Fibroids  Past Medical History  Diagnosis Date  . Anemia   . Hypertension   . Fibroids   . Hypothyroidism    Past Surgical History  Procedure Laterality Date  . Cesarean section    . Tubal ligation       Discharged Condition: good  Hospital Course: Sonya Weber is a 53 year old black female, patient of Dr. Nelva Bush, who was recently referred to the interventional radiology service for further evaluation and possible treatment of symptomatic uterine fibroids. The patient was seen in consultation by Dr. Corrie Mckusick on 11/12/2013 and deemed a suitable candidate for uterine fibroid embolization. On 01/01/2014 the patient underwent successful bilateral uterine artery embolization and fluoroscopic guided superior hypogastric nerve block via IV conscious sedation. She was subsequently admitted to the hospital for overnight observation and pain control. She was placed on a Dilaudid PCA pump. Overnight she did well with the only exception of some mild pelvic cramping and few episodes of nausea and vomiting (likely related to dilaudid use). She was given antiemetics with good control. The following morning her Dilaudid PCA pump was discontinued . On the day of discharge she felt remarkably well with only complaint of single episode of emesis following breakfast and minimal pelvic cramping. She was transitioned over to oral narcotic agents and given additional antiemetics with good relief of her symptoms. She was able to void, ambulate and  subsequently tolerate her diet without difficulty. She was seen by Dr. Laurence Ferrari and deemed suitable for discharge at this time. She was given prescriptions for ibuprofen 600 mg, #20, no refills, 1 tablet every 6 hours for the next 5 days. Phenergan 25 mg, #20, no refills, 1 tablet every 6 hours as needed for nausea. Vicodin 5/325, #30, no refills, 1-2 tablets every 4-6 hours as needed for pain. Colace 100 mg, #20, no refills, 1 tablet twice daily as needed for constipation. The patient will  be scheduled for follow-up with Dr. Earleen Newport in the interventional radiology clinic in 2-4 weeks. She will continue current gynecologic care with Dr. Nelva Bush. She was told to remain on her current home medications and contact our service with any additional questions or concerns. Consults: none  Significant Diagnostic Studies:  Results for orders placed or performed during the hospital encounter of 01/01/14  APTT  Result Value Ref Range   aPTT 27 24 - 37 seconds  Basic metabolic panel  Result Value Ref Range   Sodium 134 (L) 135 - 145 mmol/L   Potassium 3.4 (L) 3.5 - 5.1 mmol/L   Chloride 104 96 - 112 mEq/L   CO2 25 19 - 32 mmol/L   Glucose, Bld 94 70 - 99 mg/dL   BUN 11 6 - 23 mg/dL   Creatinine, Ser 0.68 0.50 - 1.10 mg/dL   Calcium 8.9 8.4 - 10.5 mg/dL   GFR calc non Af Amer >90 >90 mL/min   GFR calc Af Amer >90 >90 mL/min   Anion gap 5 5 - 15  CBC  Result Value Ref Range   WBC 6.1 4.0 - 10.5 K/uL   RBC 3.90 3.87 - 5.11 MIL/uL  Hemoglobin 8.2 (L) 12.0 - 15.0 g/dL   HCT 29.0 (L) 36.0 - 46.0 %   MCV 74.4 (L) 78.0 - 100.0 fL   MCH 21.0 (L) 26.0 - 34.0 pg   MCHC 28.3 (L) 30.0 - 36.0 g/dL   RDW 20.9 (H) 11.5 - 15.5 %   Platelets 241 150 - 400 K/uL  hCG, serum, qualitative  Result Value Ref Range   Preg, Serum NEGATIVE NEGATIVE  Protime-INR  Result Value Ref Range   Prothrombin Time 13.8 11.6 - 15.2 seconds   INR 1.05 0.00 - 1.49     Treatments: Ir Angiogram Pelvis Selective Or  Supraselective  01/01/2014   CLINICAL DATA:  53 year old female with symptomatic uterine fibroids. The patient has been experiencing both bulk type symptoms and abnormal bleeding. She desires minimally invasive treatment, and is a good candidate for bilateral uterine artery embolization.  EXAM: UTERINE FIBROID EMBOLIZATION  ULTRASOUND GUIDED ACCESS OF THE RIGHT COMMON FEMORAL ARTERY.  DEPLOYMENT OF EXOSEAL DEVICE FOR HEMOSTASIS.  FLUOROSCOPIC SUPERIOR HYPOGASTRIC NERVE BLOCK  ANESTHESIA/SEDATION: 4.0 Mg IV Versed; 200 mcg fentanyl  Total Moderate Sedation Time: 70  Minutes.  Contrast Volume: 65 ml  Additional Medications: 2.0 g Ancef, 60 mg toradol IM, 8 mg Zofran  FLUOROSCOPY TIME:  23 min, 30 seconds  PROCEDURE: The procedure, risks, benefits, and alternatives were explained to the patient. Questions regarding the procedure were encouraged and answered. The patient understands and consents to the procedure.  The right groin was prepped with Betadine in a sterile fashion, and a sterile drape was applied covering the operative field. A sterile gown and sterile gloves were used for the procedure. Local anesthesia was provided with 1% Lidocaine.  Ultrasound survey of the right inguinal region was performed with images stored and sent to PACs.  The skin and subcutaneous tissue generously infiltrated with 1% lidocaine for local anesthesia, and a micropuncture needle was advanced under ultrasound guidance into the right common femoral artery. With excellent blood flow returned, micro wire was advanced. The needle was removed and a micropuncture set was advanced over the micro wire. The inner dilator in the stylet were removed, and an 035 Bentson wire was advanced into the abdominal aorta. The micro sheath was removed, a 5 [place] vascular sheath was placed into the common femoral artery. The dilator was removed and the sheath was flushed.  A 5 French Cobra catheter was advanced over the Bentson wire and used to select  the left common iliac artery. The Cobra catheter was advanced into the internal iliac artery.  Once the catheter was over the bifurcation of the aorta, a superior hypogastric nerve block was performed via an anterior approach by directing a 22 gauge, 15 cm Chiba needle onto the anterior surface of L5 below the bifurcation of the aorta. Once the surface the needle was in contact with the vertebral body, the stylet was removed, small amount of contrast confirmed a prevertebral needle location, and a solution of contrast and 0.25% bupivacaine was infused with fluoroscopy. Approximately 12 cc of bupivacaine was administered. The needle was removed.  Angiogram of the left hypogastric artery was performed for identification of the left uterine artery. Once we identified the origin, a high-flow Renegade micro catheter was advanced to the Cobra catheter with a 0.014 Fathom wire. The micro catheter and micro wire combination were used to select the uterine artery.  Angiogram was performed.  Once we confirmed position the micro catheter within the distal descending left uterine artery, embolization was performed.  Embolization of the left uterine artery was performed with 1 and 2/3 vial of embospheres (500 - 700 microm). Embolization was performed to 5 beat stasis.  The the micro wire/micro catheter were removed, and the Cobra catheter and Bentson wire were used to form a Waltman loop. The Waltman loop was then used to select the ipsilateral hypogastric artery.  Angiogram was performed to confirm position and identified the origin of the urine artery. Once we confirmed the origin, the same micro catheter and micro wire combination were used to select the uterine artery. The catheter was advanced to the distal descending right uterine artery. Angiogram was performed to confirm position.  Embolization of the right uterine artery was performed with 1-1/3 vials of 500 -700 micro m embospheres. Five beat stasis was achieved.  The  Waltman loop was reduced via the left common iliac artery with the assistance of the Bentson wire, and then the catheter and wire were removed.  Angiogram of the right common femoral artery was performed.  An Exoseal device was deployed for hemostasis.  The patient tolerated the procedure well and remained hemodynamically stable throughout.  No complications were encountered and no significant blood loss was encountered.  COMPLICATIONS: None.  FINDINGS: Ultrasound survey of the right inguinal region demonstrates patent right common femoral artery.  Angiogram of the left internal iliac artery demonstrates the uterine artery has a separate origin from the anterior division. Images demonstrate placement of the micro catheter into the distal descending left urine artery for embolization.  After embolization was performed, 5 beat stasis was achieved.  Angiogram of the right hypogastric artery demonstrates the uterine artery origin has its origin from the inferior gluteal artery.  Embolization was performed from the distal descending right ureter in artery. Five beast stasis was achieved after embolization.  Angiogram of the right common femoral artery access demonstrates catheter in the center of the right common femoral artery.  Single oblique image of the L5 vertebral body demonstrates the needle tip in position within the prevertebral space and contrast stain within the prevertebral space.  IMPRESSION: Status post bilateral uterine artery embolization for symptomatic uterine fibroids.  Fluoroscopic guided superior hypogastric nerve block.  Deployment of Exoseal device for hemostasis.  Signed,  Dulcy Fanny. Earleen Newport DO  [facility]  [facility]  PLAN: The patient will be admitted for 1 night for observation.  Advanced diet as tolerated.  The patient should have her right leg straight for 3 hr given the right common femoral artery access.  The patient will be treated with anti  and nausea medicine scheduled and on the main hand.  Toradol IV 30 mg Q 6 hr.  A PCA pump has been administered for breakthrough pain. DC at 6 a.m. 01/02/2014.  Hydration, with 150 cc/hour of normal saline for 8 hr.  Removal of Foley catheter in the evening, or at 6 a.m. 01/02/2014.  The patient will have a follow-up appointment in 2 weeks.   Electronically Signed   By: Corrie Mckusick D.O.   On: 01/01/2014 18:55   Ir Angiogram Pelvis Selective Or Supraselective  01/01/2014   CLINICAL DATA:  53 year old female with symptomatic uterine fibroids. The patient has been experiencing both bulk type symptoms and abnormal bleeding. She desires minimally invasive treatment, and is a good candidate for bilateral uterine artery embolization.  EXAM: UTERINE FIBROID EMBOLIZATION  ULTRASOUND GUIDED ACCESS OF THE RIGHT COMMON FEMORAL ARTERY.  DEPLOYMENT OF EXOSEAL DEVICE FOR HEMOSTASIS.  FLUOROSCOPIC SUPERIOR HYPOGASTRIC NERVE BLOCK  ANESTHESIA/SEDATION: 4.0 Mg IV Versed; 200 mcg fentanyl  Total Moderate Sedation Time: 70  Minutes.  Contrast Volume: 65 ml  Additional Medications: 2.0 g Ancef, 60 mg toradol IM, 8 mg Zofran  FLUOROSCOPY TIME:  23 min, 30 seconds  PROCEDURE: The procedure, risks, benefits, and alternatives were explained to the patient. Questions regarding the procedure were encouraged and answered. The patient understands and consents to the procedure.  The right groin was prepped with Betadine in a sterile fashion, and a sterile drape was applied covering the operative field. A sterile gown and sterile gloves were used for the procedure. Local anesthesia was provided with 1% Lidocaine.  Ultrasound survey of the right inguinal region was performed with images stored and sent to PACs.  The skin and subcutaneous tissue generously infiltrated with 1% lidocaine for local anesthesia, and a micropuncture needle was advanced under ultrasound guidance into the right common femoral artery. With excellent blood flow  returned, micro wire was advanced. The needle was removed and a micropuncture set was advanced over the micro wire. The inner dilator in the stylet were removed, and an 035 Bentson wire was advanced into the abdominal aorta. The micro sheath was removed, a 5 [place] vascular sheath was placed into the common femoral artery. The dilator was removed and the sheath was flushed.  A 5 French Cobra catheter was advanced over the Bentson wire and used to select the left common iliac artery. The Cobra catheter was advanced into the internal iliac artery.  Once the catheter was over the bifurcation of the aorta, a superior hypogastric nerve block was performed via an anterior approach by directing a 22 gauge, 15 cm Chiba needle onto the anterior surface of L5 below the bifurcation of the aorta. Once the surface the needle was in contact with the vertebral body, the stylet was removed, small amount of contrast confirmed a prevertebral needle location, and a solution of contrast and 0.25% bupivacaine was infused with fluoroscopy. Approximately 12 cc of bupivacaine was administered. The needle was removed.  Angiogram of the left hypogastric artery was performed for identification of the left uterine artery. Once we identified the origin, a high-flow Renegade micro catheter was advanced to the Cobra catheter with a 0.014 Fathom wire. The micro catheter and micro wire combination were used to select the uterine artery.  Angiogram was performed.  Once we confirmed position the micro catheter within the distal descending left uterine artery, embolization was performed. Embolization of the left uterine artery was performed with 1 and 2/3 vial of embospheres (500 - 700 microm). Embolization was performed to 5 beat stasis.  The the micro wire/micro catheter were removed, and the Cobra catheter and Bentson wire were used to form a Waltman loop. The Waltman loop was then used to select the ipsilateral hypogastric artery.  Angiogram was  performed to confirm position and identified the origin of the urine artery. Once we confirmed the origin, the same micro catheter and micro wire combination were used to select the uterine artery. The catheter was advanced to the distal descending right uterine artery. Angiogram was performed to confirm position.  Embolization of the right uterine artery was performed with 1-1/3 vials of 500 -700 micro m embospheres. Five beat stasis was achieved.  The Waltman loop was reduced via the left common iliac artery with the assistance of the Bentson wire, and then the catheter and wire were removed.  Angiogram of the right common femoral artery was performed.  An Exoseal device was deployed  for hemostasis.  The patient tolerated the procedure well and remained hemodynamically stable throughout.  No complications were encountered and no significant blood loss was encountered.  COMPLICATIONS: None.  FINDINGS: Ultrasound survey of the right inguinal region demonstrates patent right common femoral artery.  Angiogram of the left internal iliac artery demonstrates the uterine artery has a separate origin from the anterior division. Images demonstrate placement of the micro catheter into the distal descending left urine artery for embolization.  After embolization was performed, 5 beat stasis was achieved.  Angiogram of the right hypogastric artery demonstrates the uterine artery origin has its origin from the inferior gluteal artery.  Embolization was performed from the distal descending right ureter in artery. Five beast stasis was achieved after embolization.  Angiogram of the right common femoral artery access demonstrates catheter in the center of the right common femoral artery.  Single oblique image of the L5 vertebral body demonstrates the needle tip in position within the prevertebral space and contrast stain within the prevertebral space.  IMPRESSION: Status post bilateral uterine artery embolization for symptomatic  uterine fibroids.  Fluoroscopic guided superior hypogastric nerve block.  Deployment of Exoseal device for hemostasis.  Signed,  Dulcy Fanny. Earleen Newport DO  [facility]  [facility]  PLAN: The patient will be admitted for 1 night for observation.  Advanced diet as tolerated.  The patient should have her right leg straight for 3 hr given the right common femoral artery access.  The patient will be treated with anti and nausea medicine scheduled and on the main hand.  Toradol IV 30 mg Q 6 hr.  A PCA pump has been administered for breakthrough pain. DC at 6 a.m. 01/02/2014.  Hydration, with 150 cc/hour of normal saline for 8 hr.  Removal of Foley catheter in the evening, or at 6 a.m. 01/02/2014.  The patient will have a follow-up appointment in 2 weeks.   Electronically Signed   By: Corrie Mckusick D.O.   On: 01/01/2014 18:55   Ir Angiogram Selective Each Additional Vessel  01/01/2014   CLINICAL DATA:  53 year old female with symptomatic uterine fibroids. The patient has been experiencing both bulk type symptoms and abnormal bleeding. She desires minimally invasive treatment, and is a good candidate for bilateral uterine artery embolization.  EXAM: UTERINE FIBROID EMBOLIZATION  ULTRASOUND GUIDED ACCESS OF THE RIGHT COMMON FEMORAL ARTERY.  DEPLOYMENT OF EXOSEAL DEVICE FOR HEMOSTASIS.  FLUOROSCOPIC SUPERIOR HYPOGASTRIC NERVE BLOCK  ANESTHESIA/SEDATION: 4.0 Mg IV Versed; 200 mcg fentanyl  Total Moderate Sedation Time: 70  Minutes.  Contrast Volume: 65 ml  Additional Medications: 2.0 g Ancef, 60 mg toradol IM, 8 mg Zofran  FLUOROSCOPY TIME:  23 min, 30 seconds  PROCEDURE: The procedure, risks, benefits, and alternatives were explained to the patient. Questions regarding the procedure were encouraged and answered. The patient understands and consents to the procedure.  The right groin was prepped with Betadine in a sterile fashion, and a sterile drape was applied covering the  operative field. A sterile gown and sterile gloves were used for the procedure. Local anesthesia was provided with 1% Lidocaine.  Ultrasound survey of the right inguinal region was performed with images stored and sent to PACs.  The skin and subcutaneous tissue generously infiltrated with 1% lidocaine for local anesthesia, and a micropuncture needle was advanced under ultrasound guidance into the right common femoral artery. With excellent blood flow returned, micro wire was advanced. The needle was removed and a micropuncture set was advanced over  the micro wire. The inner dilator in the stylet were removed, and an 035 Bentson wire was advanced into the abdominal aorta. The micro sheath was removed, a 5 [place] vascular sheath was placed into the common femoral artery. The dilator was removed and the sheath was flushed.  A 5 French Cobra catheter was advanced over the Bentson wire and used to select the left common iliac artery. The Cobra catheter was advanced into the internal iliac artery.  Once the catheter was over the bifurcation of the aorta, a superior hypogastric nerve block was performed via an anterior approach by directing a 22 gauge, 15 cm Chiba needle onto the anterior surface of L5 below the bifurcation of the aorta. Once the surface the needle was in contact with the vertebral body, the stylet was removed, small amount of contrast confirmed a prevertebral needle location, and a solution of contrast and 0.25% bupivacaine was infused with fluoroscopy. Approximately 12 cc of bupivacaine was administered. The needle was removed.  Angiogram of the left hypogastric artery was performed for identification of the left uterine artery. Once we identified the origin, a high-flow Renegade micro catheter was advanced to the Cobra catheter with a 0.014 Fathom wire. The micro catheter and micro wire combination were used to select the uterine artery.  Angiogram was performed.  Once we confirmed position the micro  catheter within the distal descending left uterine artery, embolization was performed. Embolization of the left uterine artery was performed with 1 and 2/3 vial of embospheres (500 - 700 microm). Embolization was performed to 5 beat stasis.  The the micro wire/micro catheter were removed, and the Cobra catheter and Bentson wire were used to form a Waltman loop. The Waltman loop was then used to select the ipsilateral hypogastric artery.  Angiogram was performed to confirm position and identified the origin of the urine artery. Once we confirmed the origin, the same micro catheter and micro wire combination were used to select the uterine artery. The catheter was advanced to the distal descending right uterine artery. Angiogram was performed to confirm position.  Embolization of the right uterine artery was performed with 1-1/3 vials of 500 -700 micro m embospheres. Five beat stasis was achieved.  The Waltman loop was reduced via the left common iliac artery with the assistance of the Bentson wire, and then the catheter and wire were removed.  Angiogram of the right common femoral artery was performed.  An Exoseal device was deployed for hemostasis.  The patient tolerated the procedure well and remained hemodynamically stable throughout.  No complications were encountered and no significant blood loss was encountered.  COMPLICATIONS: None.  FINDINGS: Ultrasound survey of the right inguinal region demonstrates patent right common femoral artery.  Angiogram of the left internal iliac artery demonstrates the uterine artery has a separate origin from the anterior division. Images demonstrate placement of the micro catheter into the distal descending left urine artery for embolization.  After embolization was performed, 5 beat stasis was achieved.  Angiogram of the right hypogastric artery demonstrates the uterine artery origin has its origin from the inferior gluteal artery.  Embolization was performed from the distal  descending right ureter in artery. Five beast stasis was achieved after embolization.  Angiogram of the right common femoral artery access demonstrates catheter in the center of the right common femoral artery.  Single oblique image of the L5 vertebral body demonstrates the needle tip in position within the prevertebral space and contrast stain within the prevertebral space.  IMPRESSION:  Status post bilateral uterine artery embolization for symptomatic uterine fibroids.  Fluoroscopic guided superior hypogastric nerve block.  Deployment of Exoseal device for hemostasis.  Signed,  Dulcy Fanny. Earleen Newport DO  [facility]  [facility]  PLAN: The patient will be admitted for 1 night for observation.  Advanced diet as tolerated.  The patient should have her right leg straight for 3 hr given the right common femoral artery access.  The patient will be treated with anti and nausea medicine scheduled and on the main hand.  Toradol IV 30 mg Q 6 hr.  A PCA pump has been administered for breakthrough pain. DC at 6 a.m. 01/02/2014.  Hydration, with 150 cc/hour of normal saline for 8 hr.  Removal of Foley catheter in the evening, or at 6 a.m. 01/02/2014.  The patient will have a follow-up appointment in 2 weeks.   Electronically Signed   By: Corrie Mckusick D.O.   On: 01/01/2014 18:55   Ir Angiogram Selective Each Additional Vessel  01/01/2014   CLINICAL DATA:  53 year old female with symptomatic uterine fibroids. The patient has been experiencing both bulk type symptoms and abnormal bleeding. She desires minimally invasive treatment, and is a good candidate for bilateral uterine artery embolization.  EXAM: UTERINE FIBROID EMBOLIZATION  ULTRASOUND GUIDED ACCESS OF THE RIGHT COMMON FEMORAL ARTERY.  DEPLOYMENT OF EXOSEAL DEVICE FOR HEMOSTASIS.  FLUOROSCOPIC SUPERIOR HYPOGASTRIC NERVE BLOCK  ANESTHESIA/SEDATION: 4.0 Mg IV Versed; 200 mcg fentanyl  Total Moderate Sedation Time: 70  Minutes.   Contrast Volume: 65 ml  Additional Medications: 2.0 g Ancef, 60 mg toradol IM, 8 mg Zofran  FLUOROSCOPY TIME:  23 min, 30 seconds  PROCEDURE: The procedure, risks, benefits, and alternatives were explained to the patient. Questions regarding the procedure were encouraged and answered. The patient understands and consents to the procedure.  The right groin was prepped with Betadine in a sterile fashion, and a sterile drape was applied covering the operative field. A sterile gown and sterile gloves were used for the procedure. Local anesthesia was provided with 1% Lidocaine.  Ultrasound survey of the right inguinal region was performed with images stored and sent to PACs.  The skin and subcutaneous tissue generously infiltrated with 1% lidocaine for local anesthesia, and a micropuncture needle was advanced under ultrasound guidance into the right common femoral artery. With excellent blood flow returned, micro wire was advanced. The needle was removed and a micropuncture set was advanced over the micro wire. The inner dilator in the stylet were removed, and an 035 Bentson wire was advanced into the abdominal aorta. The micro sheath was removed, a 5 [place] vascular sheath was placed into the common femoral artery. The dilator was removed and the sheath was flushed.  A 5 French Cobra catheter was advanced over the Bentson wire and used to select the left common iliac artery. The Cobra catheter was advanced into the internal iliac artery.  Once the catheter was over the bifurcation of the aorta, a superior hypogastric nerve block was performed via an anterior approach by directing a 22 gauge, 15 cm Chiba needle onto the anterior surface of L5 below the bifurcation of the aorta. Once the surface the needle was in contact with the vertebral body, the stylet was removed, small amount of contrast confirmed a prevertebral needle location, and a solution of contrast and 0.25% bupivacaine was infused with fluoroscopy.  Approximately 12 cc of bupivacaine was administered. The needle was removed.  Angiogram of the left hypogastric artery  was performed for identification of the left uterine artery. Once we identified the origin, a high-flow Renegade micro catheter was advanced to the Cobra catheter with a 0.014 Fathom wire. The micro catheter and micro wire combination were used to select the uterine artery.  Angiogram was performed.  Once we confirmed position the micro catheter within the distal descending left uterine artery, embolization was performed. Embolization of the left uterine artery was performed with 1 and 2/3 vial of embospheres (500 - 700 microm). Embolization was performed to 5 beat stasis.  The the micro wire/micro catheter were removed, and the Cobra catheter and Bentson wire were used to form a Waltman loop. The Waltman loop was then used to select the ipsilateral hypogastric artery.  Angiogram was performed to confirm position and identified the origin of the urine artery. Once we confirmed the origin, the same micro catheter and micro wire combination were used to select the uterine artery. The catheter was advanced to the distal descending right uterine artery. Angiogram was performed to confirm position.  Embolization of the right uterine artery was performed with 1-1/3 vials of 500 -700 micro m embospheres. Five beat stasis was achieved.  The Waltman loop was reduced via the left common iliac artery with the assistance of the Bentson wire, and then the catheter and wire were removed.  Angiogram of the right common femoral artery was performed.  An Exoseal device was deployed for hemostasis.  The patient tolerated the procedure well and remained hemodynamically stable throughout.  No complications were encountered and no significant blood loss was encountered.  COMPLICATIONS: None.  FINDINGS: Ultrasound survey of the right inguinal region demonstrates patent right common femoral artery.  Angiogram of the left  internal iliac artery demonstrates the uterine artery has a separate origin from the anterior division. Images demonstrate placement of the micro catheter into the distal descending left urine artery for embolization.  After embolization was performed, 5 beat stasis was achieved.  Angiogram of the right hypogastric artery demonstrates the uterine artery origin has its origin from the inferior gluteal artery.  Embolization was performed from the distal descending right ureter in artery. Five beast stasis was achieved after embolization.  Angiogram of the right common femoral artery access demonstrates catheter in the center of the right common femoral artery.  Single oblique image of the L5 vertebral body demonstrates the needle tip in position within the prevertebral space and contrast stain within the prevertebral space.  IMPRESSION: Status post bilateral uterine artery embolization for symptomatic uterine fibroids.  Fluoroscopic guided superior hypogastric nerve block.  Deployment of Exoseal device for hemostasis.  Signed,  Dulcy Fanny. Earleen Newport DO  [facility]  [facility]  PLAN: The patient will be admitted for 1 night for observation.  Advanced diet as tolerated.  The patient should have her right leg straight for 3 hr given the right common femoral artery access.  The patient will be treated with anti and nausea medicine scheduled and on the main hand.  Toradol IV 30 mg Q 6 hr.  A PCA pump has been administered for breakthrough pain. DC at 6 a.m. 01/02/2014.  Hydration, with 150 cc/hour of normal saline for 8 hr.  Removal of Foley catheter in the evening, or at 6 a.m. 01/02/2014.  The patient will have a follow-up appointment in 2 weeks.   Electronically Signed   By: Corrie Mckusick D.O.   On: 01/01/2014 18:55   Ir US Guide Vasc Access Right  01/01/2014  CLINICAL DATA:  53 year old female with symptomatic uterine fibroids. The patient has been experiencing both bulk  type symptoms and abnormal bleeding. She desires minimally invasive treatment, and is a good candidate for bilateral uterine artery embolization.  EXAM: UTERINE FIBROID EMBOLIZATION  ULTRASOUND GUIDED ACCESS OF THE RIGHT COMMON FEMORAL ARTERY.  DEPLOYMENT OF EXOSEAL DEVICE FOR HEMOSTASIS.  FLUOROSCOPIC SUPERIOR HYPOGASTRIC NERVE BLOCK  ANESTHESIA/SEDATION: 4.0 Mg IV Versed; 200 mcg fentanyl  Total Moderate Sedation Time: 70  Minutes.  Contrast Volume: 65 ml  Additional Medications: 2.0 g Ancef, 60 mg toradol IM, 8 mg Zofran  FLUOROSCOPY TIME:  23 min, 30 seconds  PROCEDURE: The procedure, risks, benefits, and alternatives were explained to the patient. Questions regarding the procedure were encouraged and answered. The patient understands and consents to the procedure.  The right groin was prepped with Betadine in a sterile fashion, and a sterile drape was applied covering the operative field. A sterile gown and sterile gloves were used for the procedure. Local anesthesia was provided with 1% Lidocaine.  Ultrasound survey of the right inguinal region was performed with images stored and sent to PACs.  The skin and subcutaneous tissue generously infiltrated with 1% lidocaine for local anesthesia, and a micropuncture needle was advanced under ultrasound guidance into the right common femoral artery. With excellent blood flow returned, micro wire was advanced. The needle was removed and a micropuncture set was advanced over the micro wire. The inner dilator in the stylet were removed, and an 035 Bentson wire was advanced into the abdominal aorta. The micro sheath was removed, a 5 [place] vascular sheath was placed into the common femoral artery. The dilator was removed and the sheath was flushed.  A 5 French Cobra catheter was advanced over the Bentson wire and used to select the left common iliac artery. The Cobra catheter was advanced into the internal iliac artery.  Once the catheter was over the bifurcation of the  aorta, a superior hypogastric nerve block was performed via an anterior approach by directing a 22 gauge, 15 cm Chiba needle onto the anterior surface of L5 below the bifurcation of the aorta. Once the surface the needle was in contact with the vertebral body, the stylet was removed, small amount of contrast confirmed a prevertebral needle location, and a solution of contrast and 0.25% bupivacaine was infused with fluoroscopy. Approximately 12 cc of bupivacaine was administered. The needle was removed.  Angiogram of the left hypogastric artery was performed for identification of the left uterine artery. Once we identified the origin, a high-flow Renegade micro catheter was advanced to the Cobra catheter with a 0.014 Fathom wire. The micro catheter and micro wire combination were used to select the uterine artery.  Angiogram was performed.  Once we confirmed position the micro catheter within the distal descending left uterine artery, embolization was performed. Embolization of the left uterine artery was performed with 1 and 2/3 vial of embospheres (500 - 700 microm). Embolization was performed to 5 beat stasis.  The the micro wire/micro catheter were removed, and the Cobra catheter and Bentson wire were used to form a Waltman loop. The Waltman loop was then used to select the ipsilateral hypogastric artery.  Angiogram was performed to confirm position and identified the origin of the urine artery. Once we confirmed the origin, the same micro catheter and micro wire combination were used to select the uterine artery. The catheter was advanced to the distal descending right uterine artery. Angiogram was performed to confirm position.  Embolization of the right uterine artery was performed with 1-1/3 vials of 500 -700 micro m embospheres. Five beat stasis was achieved.  The Waltman loop was reduced via the left common iliac artery with the assistance of the Bentson wire, and then the catheter and wire were removed.   Angiogram of the right common femoral artery was performed.  An Exoseal device was deployed for hemostasis.  The patient tolerated the procedure well and remained hemodynamically stable throughout.  No complications were encountered and no significant blood loss was encountered.  COMPLICATIONS: None.  FINDINGS: Ultrasound survey of the right inguinal region demonstrates patent right common femoral artery.  Angiogram of the left internal iliac artery demonstrates the uterine artery has a separate origin from the anterior division. Images demonstrate placement of the micro catheter into the distal descending left urine artery for embolization.  After embolization was performed, 5 beat stasis was achieved.  Angiogram of the right hypogastric artery demonstrates the uterine artery origin has its origin from the inferior gluteal artery.  Embolization was performed from the distal descending right ureter in artery. Five beast stasis was achieved after embolization.  Angiogram of the right common femoral artery access demonstrates catheter in the center of the right common femoral artery.  Single oblique image of the L5 vertebral body demonstrates the needle tip in position within the prevertebral space and contrast stain within the prevertebral space.  IMPRESSION: Status post bilateral uterine artery embolization for symptomatic uterine fibroids.  Fluoroscopic guided superior hypogastric nerve block.  Deployment of Exoseal device for hemostasis.  Signed,  Dulcy Fanny. Earleen Newport DO  [facility]  [facility]  PLAN: The patient will be admitted for 1 night for observation.  Advanced diet as tolerated.  The patient should have her right leg straight for 3 hr given the right common femoral artery access.  The patient will be treated with anti and nausea medicine scheduled and on the main hand.  Toradol IV 30 mg Q 6 hr.  A PCA pump has been administered for breakthrough pain. DC at 6  a.m. 01/02/2014.  Hydration, with 150 cc/hour of normal saline for 8 hr.  Removal of Foley catheter in the evening, or at 6 a.m. 01/02/2014.  The patient will have a follow-up appointment in 2 weeks.   Electronically Signed   By: Corrie Mckusick D.O.   On: 01/01/2014 18:55   Ir Fluoro Guide Ndl Plmt / Bx  01/01/2014   CLINICAL DATA:  53 year old female with symptomatic uterine fibroids. The patient has been experiencing both bulk type symptoms and abnormal bleeding. She desires minimally invasive treatment, and is a good candidate for bilateral uterine artery embolization.  EXAM: UTERINE FIBROID EMBOLIZATION  ULTRASOUND GUIDED ACCESS OF THE RIGHT COMMON FEMORAL ARTERY.  DEPLOYMENT OF EXOSEAL DEVICE FOR HEMOSTASIS.  FLUOROSCOPIC SUPERIOR HYPOGASTRIC NERVE BLOCK  ANESTHESIA/SEDATION: 4.0 Mg IV Versed; 200 mcg fentanyl  Total Moderate Sedation Time: 70  Minutes.  Contrast Volume: 65 ml  Additional Medications: 2.0 g Ancef, 60 mg toradol IM, 8 mg Zofran  FLUOROSCOPY TIME:  23 min, 30 seconds  PROCEDURE: The procedure, risks, benefits, and alternatives were explained to the patient. Questions regarding the procedure were encouraged and answered. The patient understands and consents to the procedure.  The right groin was prepped with Betadine in a sterile fashion, and a sterile drape was applied covering the operative field. A sterile gown and sterile gloves were used for the procedure. Local anesthesia was provided with 1% Lidocaine.  Ultrasound survey of the right inguinal region was performed with images stored and sent to PACs.  The skin and subcutaneous tissue generously infiltrated with 1% lidocaine for local anesthesia, and a micropuncture needle was advanced under ultrasound guidance into the right common femoral artery. With excellent blood flow returned, micro wire was advanced. The needle was removed and a micropuncture set was advanced over the micro wire. The inner dilator in the stylet were removed, and an  035 Bentson wire was advanced into the abdominal aorta. The micro sheath was removed, a 5 [place] vascular sheath was placed into the common femoral artery. The dilator was removed and the sheath was flushed.  A 5 French Cobra catheter was advanced over the Bentson wire and used to select the left common iliac artery. The Cobra catheter was advanced into the internal iliac artery.  Once the catheter was over the bifurcation of the aorta, a superior hypogastric nerve block was performed via an anterior approach by directing a 22 gauge, 15 cm Chiba needle onto the anterior surface of L5 below the bifurcation of the aorta. Once the surface the needle was in contact with the vertebral body, the stylet was removed, small amount of contrast confirmed a prevertebral needle location, and a solution of contrast and 0.25% bupivacaine was infused with fluoroscopy. Approximately 12 cc of bupivacaine was administered. The needle was removed.  Angiogram of the left hypogastric artery was performed for identification of the left uterine artery. Once we identified the origin, a high-flow Renegade micro catheter was advanced to the Cobra catheter with a 0.014 Fathom wire. The micro catheter and micro wire combination were used to select the uterine artery.  Angiogram was performed.  Once we confirmed position the micro catheter within the distal descending left uterine artery, embolization was performed. Embolization of the left uterine artery was performed with 1 and 2/3 vial of embospheres (500 - 700 microm). Embolization was performed to 5 beat stasis.  The the micro wire/micro catheter were removed, and the Cobra catheter and Bentson wire were used to form a Waltman loop. The Waltman loop was then used to select the ipsilateral hypogastric artery.  Angiogram was performed to confirm position and identified the origin of the urine artery. Once we confirmed the origin, the same micro catheter and micro wire combination were used to  select the uterine artery. The catheter was advanced to the distal descending right uterine artery. Angiogram was performed to confirm position.  Embolization of the right uterine artery was performed with 1-1/3 vials of 500 -700 micro m embospheres. Five beat stasis was achieved.  The Waltman loop was reduced via the left common iliac artery with the assistance of the Bentson wire, and then the catheter and wire were removed.  Angiogram of the right common femoral artery was performed.  An Exoseal device was deployed for hemostasis.  The patient tolerated the procedure well and remained hemodynamically stable throughout.  No complications were encountered and no significant blood loss was encountered.  COMPLICATIONS: None.  FINDINGS: Ultrasound survey of the right inguinal region demonstrates patent right common femoral artery.  Angiogram of the left internal iliac artery demonstrates the uterine artery has a separate origin from the anterior division. Images demonstrate placement of the micro catheter into the distal descending left urine artery for embolization.  After embolization was performed, 5 beat stasis was achieved.  Angiogram of the right hypogastric artery demonstrates the uterine artery origin has its origin from the inferior gluteal artery.  Embolization  was performed from the distal descending right ureter in artery. Five beast stasis was achieved after embolization.  Angiogram of the right common femoral artery access demonstrates catheter in the center of the right common femoral artery.  Single oblique image of the L5 vertebral body demonstrates the needle tip in position within the prevertebral space and contrast stain within the prevertebral space.  IMPRESSION: Status post bilateral uterine artery embolization for symptomatic uterine fibroids.  Fluoroscopic guided superior hypogastric nerve block.  Deployment of Exoseal device for hemostasis.  Signed,  Dulcy Fanny. Earleen Newport DO  [facility]  [facility]  PLAN: The patient will be admitted for 1 night for observation.  Advanced diet as tolerated.  The patient should have her right leg straight for 3 hr given the right common femoral artery access.  The patient will be treated with anti and nausea medicine scheduled and on the main hand.  Toradol IV 30 mg Q 6 hr.  A PCA pump has been administered for breakthrough pain. DC at 6 a.m. 01/02/2014.  Hydration, with 150 cc/hour of normal saline for 8 hr.  Removal of Foley catheter in the evening, or at 6 a.m. 01/02/2014.  The patient will have a follow-up appointment in 2 weeks.   Electronically Signed   By: Corrie Mckusick D.O.   On: 01/01/2014 18:55   Ir Embo Tumor Organ Ischemia Infarct Inc Guide Roadmapping  01/01/2014   CLINICAL DATA:  53 year old female with symptomatic uterine fibroids. The patient has been experiencing both bulk type symptoms and abnormal bleeding. She desires minimally invasive treatment, and is a good candidate for bilateral uterine artery embolization.  EXAM: UTERINE FIBROID EMBOLIZATION  ULTRASOUND GUIDED ACCESS OF THE RIGHT COMMON FEMORAL ARTERY.  DEPLOYMENT OF EXOSEAL DEVICE FOR HEMOSTASIS.  FLUOROSCOPIC SUPERIOR HYPOGASTRIC NERVE BLOCK  ANESTHESIA/SEDATION: 4.0 Mg IV Versed; 200 mcg fentanyl  Total Moderate Sedation Time: 70  Minutes.  Contrast Volume: 65 ml  Additional Medications: 2.0 g Ancef, 60 mg toradol IM, 8 mg Zofran  FLUOROSCOPY TIME:  23 min, 30 seconds  PROCEDURE: The procedure, risks, benefits, and alternatives were explained to the patient. Questions regarding the procedure were encouraged and answered. The patient understands and consents to the procedure.  The right groin was prepped with Betadine in a sterile fashion, and a sterile drape was applied covering the operative field. A sterile gown and sterile gloves were used for the procedure. Local anesthesia was provided with 1% Lidocaine.  Ultrasound survey of the  right inguinal region was performed with images stored and sent to PACs.  The skin and subcutaneous tissue generously infiltrated with 1% lidocaine for local anesthesia, and a micropuncture needle was advanced under ultrasound guidance into the right common femoral artery. With excellent blood flow returned, micro wire was advanced. The needle was removed and a micropuncture set was advanced over the micro wire. The inner dilator in the stylet were removed, and an 035 Bentson wire was advanced into the abdominal aorta. The micro sheath was removed, a 5 [place] vascular sheath was placed into the common femoral artery. The dilator was removed and the sheath was flushed.  A 5 French Cobra catheter was advanced over the Bentson wire and used to select the left common iliac artery. The Cobra catheter was advanced into the internal iliac artery.  Once the catheter was over the bifurcation of the aorta, a superior hypogastric nerve block was performed via an anterior approach by directing a 22 gauge, 15 cm [place]  needle onto the anterior surface of L5 below the bifurcation of the aorta. Once the surface the needle was in contact with the vertebral body, the stylet was removed, small amount of contrast confirmed a prevertebral needle location, and a solution of contrast and 0.25% bupivacaine was infused with fluoroscopy. Approximately 12 cc of bupivacaine was administered. The needle was removed.  Angiogram of the left hypogastric artery was performed for identification of the left uterine artery. Once we identified the origin, a high-flow Renegade micro catheter was advanced to the Cobra catheter with a 0.014 Fathom wire. The micro catheter and micro wire combination were used to select the uterine artery.  Angiogram was performed.  Once we confirmed position the micro catheter within the distal descending left uterine artery, embolization was performed. Embolization of the left uterine artery was performed with 1 and 2/3  vial of embospheres (500 - 700 microm). Embolization was performed to 5 beat stasis.  The the micro wire/micro catheter were removed, and the Cobra catheter and Bentson wire were used to form a Waltman loop. The Waltman loop was then used to select the ipsilateral hypogastric artery.  Angiogram was performed to confirm position and identified the origin of the urine artery. Once we confirmed the origin, the same micro catheter and micro wire combination were used to select the uterine artery. The catheter was advanced to the distal descending right uterine artery. Angiogram was performed to confirm position.  Embolization of the right uterine artery was performed with 1-1/3 vials of 500 -700 micro m embospheres. Five beat stasis was achieved.  The Waltman loop was reduced via the left common iliac artery with the assistance of the Bentson wire, and then the catheter and wire were removed.  Angiogram of the right common femoral artery was performed.  An Exoseal device was deployed for hemostasis.  The patient tolerated the procedure well and remained hemodynamically stable throughout.  No complications were encountered and no significant blood loss was encountered.  COMPLICATIONS: None.  FINDINGS: Ultrasound survey of the right inguinal region demonstrates patent right common femoral artery.  Angiogram of the left internal iliac artery demonstrates the uterine artery has a separate origin from the anterior division. Images demonstrate placement of the micro catheter into the distal descending left urine artery for embolization.  After embolization was performed, 5 beat stasis was achieved.  Angiogram of the right hypogastric artery demonstrates the uterine artery origin has its origin from the inferior gluteal artery.  Embolization was performed from the distal descending right ureter in artery. Five beast stasis was achieved after embolization.  Angiogram of the right common femoral artery access demonstrates  catheter in the center of the right common femoral artery.  Single oblique image of the L5 vertebral body demonstrates the needle tip in position within the prevertebral space and contrast stain within the prevertebral space.  IMPRESSION: Status post bilateral uterine artery embolization for symptomatic uterine fibroids.  Fluoroscopic guided superior hypogastric nerve block.  Deployment of Exoseal device for hemostasis.  Signed,  Dulcy Fanny. Earleen Newport DO  [facility]  [facility]  PLAN: The patient will be admitted for 1 night for observation.  Advanced diet as tolerated.  The patient should have her right leg straight for 3 hr given the right common femoral artery access.  The patient will be treated with anti and nausea medicine scheduled and on the main hand.  Toradol IV 30 mg Q 6 hr.  A PCA pump has been administered for  breakthrough pain. DC at 6 a.m. 01/02/2014.  Hydration, with 150 cc/hour of normal saline for 8 hr.  Removal of Foley catheter in the evening, or at 6 a.m. 01/02/2014.  The patient will have a follow-up appointment in 2 weeks.   Electronically Signed   By: Corrie Mckusick D.O.   On: 01/01/2014 18:55      Discharge Exam: Blood pressure 153/80, pulse 68, temperature 98.3 F (36.8 C), temperature source Oral, resp. rate 16, height 5\' 7"  (1.702 m), weight 174 lb (78.926 kg), last menstrual period 12/10/2013, SpO2 100 %. Patient is awake, alert and oriented. Chest is clear to auscultation bilaterally. Heart with regular rate and rhythm. Abdomen soft, positive bowel sounds, nontender. Puncture site right common femoral artery clean, dry, nontender, no hematoma. Extremities with full range of motion, no edema, intact distal pulses.  Disposition: home  Discharge Instructions    Call MD for:  difficulty breathing, headache or visual disturbances    Complete by:  As directed      Call MD for:  extreme fatigue    Complete by:  As directed      Call  MD for:  hives    Complete by:  As directed      Call MD for:  persistant dizziness or light-headedness    Complete by:  As directed      Call MD for:  persistant nausea and vomiting    Complete by:  As directed      Call MD for:  redness, tenderness, or signs of infection (pain, swelling, redness, odor or green/yellow discharge around incision site)    Complete by:  As directed      Call MD for:  severe uncontrolled pain    Complete by:  As directed      Call MD for:  temperature >100.4    Complete by:  As directed      Change dressing (specify)    Complete by:  As directed   May change bandage over right groin today and apply bandaid to site. May change bandaid daily for next 2-3 days. May wash site with soap and water.     Diet - low sodium heart healthy    Complete by:  As directed      Driving Restrictions    Complete by:  As directed   No driving for next hours     Increase activity slowly    Complete by:  As directed      Lifting restrictions    Complete by:  As directed   No heavy lifting for next 3-4 days     May shower / Bathe    Complete by:  As directed      May walk up steps    Complete by:  As directed      Sexual Activity Restrictions    Complete by:  As directed   No sexual intercourse for 1 week            Medication List    STOP taking these medications        HYDROcodone-homatropine 5-1.5 MG/5ML syrup  Commonly known as:  HYCODAN     oseltamivir 75 MG capsule  Commonly known as:  TAMIFLU      TAKE these medications        amLODipine 5 MG tablet  Commonly known as:  NORVASC  Take 5 mg by mouth daily with breakfast.     ipratropium 0.03 % nasal spray  Commonly  known as:  ATROVENT  Place 2 sprays into the nose every 12 (twelve) hours.     levothyroxine 25 MCG tablet  Commonly known as:  SYNTHROID, LEVOTHROID  Take 25 mcg by mouth daily before breakfast.     olmesartan-hydrochlorothiazide 40-25 MG per tablet  Commonly known as:  BENICAR HCT   Take 1 tablet by mouth daily with breakfast.           Follow-up Information    Follow up with WAGNER, JAIME, DO.   Specialty:  Interventional Radiology   Why:  radiology will call you with follow up appt with Dr. Earleen Newport in 2-4 weeks; call [phone] or [phone] with any questions   Contact information:   [place] [id number] [phone]       Follow up with Clydie Braun, MD.   Specialty:  Obstetrics and Gynecology   Why:  continue current scheduled appts with Dr. Rosanne Sack information:   [address] [place] [address] [phone]             I have spent greater than 30 minutes coordinating discharge for Johnathan R Wearing.    SignedRowe Robert, [facility]  01/02/2014, 10:46 AM

## 2014-01-02 NOTE — Progress Notes (Signed)
Nurse reviewed discharge instructions with pt.  Pt given note for work and prescriptions.  Pt verbalized understanding of discharge instructions, follow up appointments and new medications.  No concerns at time of discharge.

## 2014-01-06 ENCOUNTER — Telehealth: Payer: Self-pay | Admitting: Emergency Medicine

## 2014-01-06 NOTE — Telephone Encounter (Signed)
Pt called w/ c/c/ of tender abd and burning w/ urination.  She does also experience the need to go and not ! ? If she has a UTI Scheduled her f/u appt w/ Dr Earleen Newport, but will have PA to call her back.  11:59am - paged Ascencion Dike, PA

## 2014-01-16 ENCOUNTER — Ambulatory Visit
Admission: RE | Admit: 2014-01-16 | Discharge: 2014-01-16 | Disposition: A | Discharge status: Home / Self Care | Payer: 59 | Source: Ambulatory Visit | Attending: Radiology | Admitting: Radiology

## 2014-01-16 DIAGNOSIS — N920 Excessive and frequent menstruation with regular cycle: Secondary | ICD-10-CM

## 2014-01-16 DIAGNOSIS — D259 Leiomyoma of uterus, unspecified: Secondary | ICD-10-CM

## 2014-01-16 HISTORY — PX: IR GENERIC HISTORICAL: IMG1180011

## 2014-01-16 NOTE — Progress Notes (Signed)
2 wk follow up [place].  Has not yet experienced a menstrual cycle post [place].  Minimal spotting.  Minimal cramping.  Afebrile.  Returned to work last week.  Has resumed all usual activities.  Kamala Kolton Riki Rusk, [place] 01/16/2014 3:16 PM

## 2014-01-16 NOTE — Care Management (Signed)
Chief Complaint: Chief Complaint  Patient presents with  . Follow-up    2 wk follow up [place] for symptomatic uterine fibroids    Referring Physician(s): Allred,D Lennette Bihari  History of Present Illness: Sonya Weber is a 54 y.o. female presenting today for a 2-week scheduled follow-up after her uterine fibroid embolization and superior hypogastric nerve block completed 01/01/2014.  She was observed 1 night in the hospital and discharged uneventfully on 01/02/2014.    Sonya Weber experienced some burning with urination with urgency the week following the [place], and called for an opinion.  I spoke with her directly on the telephone, and also spoke to her OB/GYN, Dr. Nelva Bush.  Sonya Weber presented at that time to the office of Dr. Earleen Newport for a urinalysis and prescription for antibiotics.  By Sonya Weber's history, the UA revealed a UTI, the symptoms of which have resolved after a 10 day course of abx.   She reports that she had very minimal symptoms after the [place], with minimal cramping that she dismisses as being no more than a nuisance.  She reports completing the post-op anti--inflammatory prescription, with no need for the anti-nausea meds, and no need for the vicodin prescription.    She states that she has returned to all of her activities of daily living at this point, and she has returned to work with no need for work restrictions.  She is feeling very well, and she is very satisfied thus far with the results.  She reports minimal spotting on her pads at this point, having not experienced any menses at this point.  She reports being 1 week late for her usual cycle.    I counselled Sonya Weber regarding the expected post-operative course, specifically the first 3 months of cycling.  In particular, I discussed that the first period after the treatment is very unpredictable, with bleeding ranging from none to significant amounts.  Any bleeding should improve over the next few periods, with an anticipated  return scheduled visit at 6 months.  If she should experience any tissue discharge, we also expect this, as some fibroids with an interface with the endometrium may slough into the canal.    She denies any concerning discharge at this time.   Past Medical History  Diagnosis Date  . Anemia   . Hypertension   . Fibroids   . Hypothyroidism     Past Surgical History  Procedure Laterality Date  . Cesarean section    . Tubal ligation      Allergies: Review of patient's allergies indicates no known allergies.  Medications: Prior to Admission medications   Medication Sig Start Date End Date Taking? Authorizing Provider  amLODipine (NORVASC) 5 MG tablet Take 5 mg by mouth daily with breakfast.     Historical Provider, MD  ipratropium (ATROVENT) 0.03 % nasal spray Place 2 sprays into the nose every 12 (twelve) hours. Patient not taking: Reported on 01/01/2014 02/05/12   Thao P Le, DO  levothyroxine (SYNTHROID, LEVOTHROID) 25 MCG tablet Take 25 mcg by mouth daily before breakfast.    Historical Provider, MD  olmesartan-hydrochlorothiazide (BENICAR HCT) 40-25 MG per tablet Take 1 tablet by mouth daily with breakfast.     Historical Provider, MD    No family history on file.  History   Social History  . Marital Status: Divorced    Spouse Name: N/A    Number of Children: N/A  . Years of Education: N/A   Social History Main Topics  .  Smoking status: Never Smoker   . Smokeless tobacco: Not on file  . Alcohol Use: Not on file  . Drug Use: Not on file  . Sexual Activity: Not on file   Other Topics Concern  . Not on file   Social History Narrative    ECOG Status: 0 - Asymptomatic  Review of Systems: A 12 point ROS discussed and pertinent positives are indicated in the HPI above.  All other systems are negative.  Review of Systems  Vital Signs: BP 156/86 mmHg  Pulse 99  Temp(Src) 98.5 F (36.9 C) (Oral)  Resp 15  SpO2 100%  LMP 12/10/2013 (Exact Date)  Physical  Exam Targeted physical exam reveals palpable right femoral pulse, with no concern for hematoma or pseudo-aneurysm.     Imaging: Ir Angiogram Pelvis Selective Or Supraselective  01/01/2014   CLINICAL DATA:  54 year old female with symptomatic uterine fibroids. The patient has been experiencing both bulk type symptoms and abnormal bleeding. She desires minimally invasive treatment, and is a good candidate for bilateral uterine artery embolization.  EXAM: UTERINE FIBROID EMBOLIZATION  ULTRASOUND GUIDED ACCESS OF THE RIGHT COMMON FEMORAL ARTERY.  DEPLOYMENT OF EXOSEAL DEVICE FOR HEMOSTASIS.  FLUOROSCOPIC SUPERIOR HYPOGASTRIC NERVE BLOCK  ANESTHESIA/SEDATION: 4.0 Mg IV Versed; 200 mcg fentanyl  Total Moderate Sedation Time: 70  Minutes.  Contrast Volume: 65 ml  Additional Medications: 2.0 g Ancef, 60 mg toradol IM, 8 mg Zofran  FLUOROSCOPY TIME:  23 min, 30 seconds  PROCEDURE: The procedure, risks, benefits, and alternatives were explained to the patient. Questions regarding the procedure were encouraged and answered. The patient understands and consents to the procedure.  The right groin was prepped with Betadine in a sterile fashion, and a sterile drape was applied covering the operative field. A sterile gown and sterile gloves were used for the procedure. Local anesthesia was provided with 1% Lidocaine.  Ultrasound survey of the right inguinal region was performed with images stored and sent to PACs.  The skin and subcutaneous tissue generously infiltrated with 1% lidocaine for local anesthesia, and a micropuncture needle was advanced under ultrasound guidance into the right common femoral artery. With excellent blood flow returned, micro wire was advanced. The needle was removed and a micropuncture set was advanced over the micro wire. The inner dilator in the stylet were removed, and an 035 Bentson wire was advanced into the abdominal aorta. The micro sheath was removed, a 5 [place] vascular sheath was placed  into the common femoral artery. The dilator was removed and the sheath was flushed.  A 5 French Cobra catheter was advanced over the Bentson wire and used to select the left common iliac artery. The Cobra catheter was advanced into the internal iliac artery.  Once the catheter was over the bifurcation of the aorta, a superior hypogastric nerve block was performed via an anterior approach by directing a 22 gauge, 15 cm Chiba needle onto the anterior surface of L5 below the bifurcation of the aorta. Once the surface the needle was in contact with the vertebral body, the stylet was removed, small amount of contrast confirmed a prevertebral needle location, and a solution of contrast and 0.25% bupivacaine was infused with fluoroscopy. Approximately 12 cc of bupivacaine was administered. The needle was removed.  Angiogram of the left hypogastric artery was performed for identification of the left uterine artery. Once we identified the origin, a high-flow Renegade micro catheter was advanced to the Cobra catheter with a 0.014 Fathom wire. The micro catheter and micro  wire combination were used to select the uterine artery.  Angiogram was performed.  Once we confirmed position the micro catheter within the distal descending left uterine artery, embolization was performed. Embolization of the left uterine artery was performed with 1 and 2/3 vial of embospheres (500 - 700 microm). Embolization was performed to 5 beat stasis.  The the micro wire/micro catheter were removed, and the Cobra catheter and Bentson wire were used to form a Waltman loop. The Waltman loop was then used to select the ipsilateral hypogastric artery.  Angiogram was performed to confirm position and identified the origin of the urine artery. Once we confirmed the origin, the same micro catheter and micro wire combination were used to select the uterine artery. The catheter was advanced to the distal descending right uterine artery. Angiogram was performed  to confirm position.  Embolization of the right uterine artery was performed with 1-1/3 vials of 500 -700 micro m embospheres. Five beat stasis was achieved.  The Waltman loop was reduced via the left common iliac artery with the assistance of the Bentson wire, and then the catheter and wire were removed.  Angiogram of the right common femoral artery was performed.  An Exoseal device was deployed for hemostasis.  The patient tolerated the procedure well and remained hemodynamically stable throughout.  No complications were encountered and no significant blood loss was encountered.  COMPLICATIONS: None.  FINDINGS: Ultrasound survey of the right inguinal region demonstrates patent right common femoral artery.  Angiogram of the left internal iliac artery demonstrates the uterine artery has a separate origin from the anterior division. Images demonstrate placement of the micro catheter into the distal descending left urine artery for embolization.  After embolization was performed, 5 beat stasis was achieved.  Angiogram of the right hypogastric artery demonstrates the uterine artery origin has its origin from the inferior gluteal artery.  Embolization was performed from the distal descending right ureter in artery. Five beast stasis was achieved after embolization.  Angiogram of the right common femoral artery access demonstrates catheter in the center of the right common femoral artery.  Single oblique image of the L5 vertebral body demonstrates the needle tip in position within the prevertebral space and contrast stain within the prevertebral space.  IMPRESSION: Status post bilateral uterine artery embolization for symptomatic uterine fibroids.  Fluoroscopic guided superior hypogastric nerve block.  Deployment of Exoseal device for hemostasis.  Signed,  Dulcy Fanny. Earleen Newport DO  [facility]  [facility]  PLAN: The patient will be admitted for 1 night for observation.  Advanced  diet as tolerated.  The patient should have her right leg straight for 3 hr given the right common femoral artery access.  The patient will be treated with anti and nausea medicine scheduled and on the main hand.  Toradol IV 30 mg Q 6 hr.  A PCA pump has been administered for breakthrough pain. DC at 6 a.m. 01/02/2014.  Hydration, with 150 cc/hour of normal saline for 8 hr.  Removal of Foley catheter in the evening, or at 6 a.m. 01/02/2014.  The patient will have a follow-up appointment in 2 weeks.   Electronically Signed   By: Corrie Mckusick D.O.   On: 01/01/2014 18:55   Ir Angiogram Pelvis Selective Or Supraselective  01/01/2014   CLINICAL DATA:  54 year old female with symptomatic uterine fibroids. The patient has been experiencing both bulk type symptoms and abnormal bleeding. She desires minimally invasive treatment, and is a good candidate for bilateral  uterine artery embolization.  EXAM: UTERINE FIBROID EMBOLIZATION  ULTRASOUND GUIDED ACCESS OF THE RIGHT COMMON FEMORAL ARTERY.  DEPLOYMENT OF EXOSEAL DEVICE FOR HEMOSTASIS.  FLUOROSCOPIC SUPERIOR HYPOGASTRIC NERVE BLOCK  ANESTHESIA/SEDATION: 4.0 Mg IV Versed; 200 mcg fentanyl  Total Moderate Sedation Time: 70  Minutes.  Contrast Volume: 65 ml  Additional Medications: 2.0 g Ancef, 60 mg toradol IM, 8 mg Zofran  FLUOROSCOPY TIME:  23 min, 30 seconds  PROCEDURE: The procedure, risks, benefits, and alternatives were explained to the patient. Questions regarding the procedure were encouraged and answered. The patient understands and consents to the procedure.  The right groin was prepped with Betadine in a sterile fashion, and a sterile drape was applied covering the operative field. A sterile gown and sterile gloves were used for the procedure. Local anesthesia was provided with 1% Lidocaine.  Ultrasound survey of the right inguinal region was performed with images stored and sent to PACs.  The skin and subcutaneous tissue generously infiltrated with 1%  lidocaine for local anesthesia, and a micropuncture needle was advanced under ultrasound guidance into the right common femoral artery. With excellent blood flow returned, micro wire was advanced. The needle was removed and a micropuncture set was advanced over the micro wire. The inner dilator in the stylet were removed, and an 035 Bentson wire was advanced into the abdominal aorta. The micro sheath was removed, a 5 [place] vascular sheath was placed into the common femoral artery. The dilator was removed and the sheath was flushed.  A 5 French Cobra catheter was advanced over the Bentson wire and used to select the left common iliac artery. The Cobra catheter was advanced into the internal iliac artery.  Once the catheter was over the bifurcation of the aorta, a superior hypogastric nerve block was performed via an anterior approach by directing a 22 gauge, 15 cm Chiba needle onto the anterior surface of L5 below the bifurcation of the aorta. Once the surface the needle was in contact with the vertebral body, the stylet was removed, small amount of contrast confirmed a prevertebral needle location, and a solution of contrast and 0.25% bupivacaine was infused with fluoroscopy. Approximately 12 cc of bupivacaine was administered. The needle was removed.  Angiogram of the left hypogastric artery was performed for identification of the left uterine artery. Once we identified the origin, a high-flow Renegade micro catheter was advanced to the Cobra catheter with a 0.014 Fathom wire. The micro catheter and micro wire combination were used to select the uterine artery.  Angiogram was performed.  Once we confirmed position the micro catheter within the distal descending left uterine artery, embolization was performed. Embolization of the left uterine artery was performed with 1 and 2/3 vial of embospheres (500 - 700 microm). Embolization was performed to 5 beat stasis.  The the micro wire/micro catheter were removed, and  the Cobra catheter and Bentson wire were used to form a Waltman loop. The Waltman loop was then used to select the ipsilateral hypogastric artery.  Angiogram was performed to confirm position and identified the origin of the urine artery. Once we confirmed the origin, the same micro catheter and micro wire combination were used to select the uterine artery. The catheter was advanced to the distal descending right uterine artery. Angiogram was performed to confirm position.  Embolization of the right uterine artery was performed with 1-1/3 vials of 500 -700 micro m embospheres. Five beat stasis was achieved.  The Waltman loop was reduced via the left common iliac  artery with the assistance of the Bentson wire, and then the catheter and wire were removed.  Angiogram of the right common femoral artery was performed.  An Exoseal device was deployed for hemostasis.  The patient tolerated the procedure well and remained hemodynamically stable throughout.  No complications were encountered and no significant blood loss was encountered.  COMPLICATIONS: None.  FINDINGS: Ultrasound survey of the right inguinal region demonstrates patent right common femoral artery.  Angiogram of the left internal iliac artery demonstrates the uterine artery has a separate origin from the anterior division. Images demonstrate placement of the micro catheter into the distal descending left urine artery for embolization.  After embolization was performed, 5 beat stasis was achieved.  Angiogram of the right hypogastric artery demonstrates the uterine artery origin has its origin from the inferior gluteal artery.  Embolization was performed from the distal descending right ureter in artery. Five beast stasis was achieved after embolization.  Angiogram of the right common femoral artery access demonstrates catheter in the center of the right common femoral artery.  Single oblique image of the L5 vertebral body demonstrates the needle tip in  position within the prevertebral space and contrast stain within the prevertebral space.  IMPRESSION: Status post bilateral uterine artery embolization for symptomatic uterine fibroids.  Fluoroscopic guided superior hypogastric nerve block.  Deployment of Exoseal device for hemostasis.  Signed,  Dulcy Fanny. Earleen Newport DO  [facility]  [facility]  PLAN: The patient will be admitted for 1 night for observation.  Advanced diet as tolerated.  The patient should have her right leg straight for 3 hr given the right common femoral artery access.  The patient will be treated with anti and nausea medicine scheduled and on the main hand.  Toradol IV 30 mg Q 6 hr.  A PCA pump has been administered for breakthrough pain. DC at 6 a.m. 01/02/2014.  Hydration, with 150 cc/hour of normal saline for 8 hr.  Removal of Foley catheter in the evening, or at 6 a.m. 01/02/2014.  The patient will have a follow-up appointment in 2 weeks.   Electronically Signed   By: Corrie Mckusick D.O.   On: 01/01/2014 18:55   Ir Angiogram Selective Each Additional Vessel  01/01/2014   CLINICAL DATA:  54 year old female with symptomatic uterine fibroids. The patient has been experiencing both bulk type symptoms and abnormal bleeding. She desires minimally invasive treatment, and is a good candidate for bilateral uterine artery embolization.  EXAM: UTERINE FIBROID EMBOLIZATION  ULTRASOUND GUIDED ACCESS OF THE RIGHT COMMON FEMORAL ARTERY.  DEPLOYMENT OF EXOSEAL DEVICE FOR HEMOSTASIS.  FLUOROSCOPIC SUPERIOR HYPOGASTRIC NERVE BLOCK  ANESTHESIA/SEDATION: 4.0 Mg IV Versed; 200 mcg fentanyl  Total Moderate Sedation Time: 70  Minutes.  Contrast Volume: 65 ml  Additional Medications: 2.0 g Ancef, 60 mg toradol IM, 8 mg Zofran  FLUOROSCOPY TIME:  23 min, 30 seconds  PROCEDURE: The procedure, risks, benefits, and alternatives were explained to the patient. Questions regarding the procedure were encouraged and answered. The  patient understands and consents to the procedure.  The right groin was prepped with Betadine in a sterile fashion, and a sterile drape was applied covering the operative field. A sterile gown and sterile gloves were used for the procedure. Local anesthesia was provided with 1% Lidocaine.  Ultrasound survey of the right inguinal region was performed with images stored and sent to PACs.  The skin and subcutaneous tissue generously infiltrated with 1% lidocaine for local anesthesia, and a micropuncture  needle was advanced under ultrasound guidance into the right common femoral artery. With excellent blood flow returned, micro wire was advanced. The needle was removed and a micropuncture set was advanced over the micro wire. The inner dilator in the stylet were removed, and an 035 Bentson wire was advanced into the abdominal aorta. The micro sheath was removed, a 5 [place] vascular sheath was placed into the common femoral artery. The dilator was removed and the sheath was flushed.  A 5 French Cobra catheter was advanced over the Bentson wire and used to select the left common iliac artery. The Cobra catheter was advanced into the internal iliac artery.  Once the catheter was over the bifurcation of the aorta, a superior hypogastric nerve block was performed via an anterior approach by directing a 22 gauge, 15 cm Chiba needle onto the anterior surface of L5 below the bifurcation of the aorta. Once the surface the needle was in contact with the vertebral body, the stylet was removed, small amount of contrast confirmed a prevertebral needle location, and a solution of contrast and 0.25% bupivacaine was infused with fluoroscopy. Approximately 12 cc of bupivacaine was administered. The needle was removed.  Angiogram of the left hypogastric artery was performed for identification of the left uterine artery. Once we identified the origin, a high-flow Renegade micro catheter was advanced to the Cobra catheter with a 0.014  Fathom wire. The micro catheter and micro wire combination were used to select the uterine artery.  Angiogram was performed.  Once we confirmed position the micro catheter within the distal descending left uterine artery, embolization was performed. Embolization of the left uterine artery was performed with 1 and 2/3 vial of embospheres (500 - 700 microm). Embolization was performed to 5 beat stasis.  The the micro wire/micro catheter were removed, and the Cobra catheter and Bentson wire were used to form a Waltman loop. The Waltman loop was then used to select the ipsilateral hypogastric artery.  Angiogram was performed to confirm position and identified the origin of the urine artery. Once we confirmed the origin, the same micro catheter and micro wire combination were used to select the uterine artery. The catheter was advanced to the distal descending right uterine artery. Angiogram was performed to confirm position.  Embolization of the right uterine artery was performed with 1-1/3 vials of 500 -700 micro m embospheres. Five beat stasis was achieved.  The Waltman loop was reduced via the left common iliac artery with the assistance of the Bentson wire, and then the catheter and wire were removed.  Angiogram of the right common femoral artery was performed.  An Exoseal device was deployed for hemostasis.  The patient tolerated the procedure well and remained hemodynamically stable throughout.  No complications were encountered and no significant blood loss was encountered.  COMPLICATIONS: None.  FINDINGS: Ultrasound survey of the right inguinal region demonstrates patent right common femoral artery.  Angiogram of the left internal iliac artery demonstrates the uterine artery has a separate origin from the anterior division. Images demonstrate placement of the micro catheter into the distal descending left urine artery for embolization.  After embolization was performed, 5 beat stasis was achieved.  Angiogram of  the right hypogastric artery demonstrates the uterine artery origin has its origin from the inferior gluteal artery.  Embolization was performed from the distal descending right ureter in artery. Five beast stasis was achieved after embolization.  Angiogram of the right common femoral artery access demonstrates catheter in the center of the  right common femoral artery.  Single oblique image of the L5 vertebral body demonstrates the needle tip in position within the prevertebral space and contrast stain within the prevertebral space.  IMPRESSION: Status post bilateral uterine artery embolization for symptomatic uterine fibroids.  Fluoroscopic guided superior hypogastric nerve block.  Deployment of Exoseal device for hemostasis.  Signed,  Dulcy Fanny. Earleen Newport DO  [facility]  [facility]  PLAN: The patient will be admitted for 1 night for observation.  Advanced diet as tolerated.  The patient should have her right leg straight for 3 hr given the right common femoral artery access.  The patient will be treated with anti and nausea medicine scheduled and on the main hand.  Toradol IV 30 mg Q 6 hr.  A PCA pump has been administered for breakthrough pain. DC at 6 a.m. 01/02/2014.  Hydration, with 150 cc/hour of normal saline for 8 hr.  Removal of Foley catheter in the evening, or at 6 a.m. 01/02/2014.  The patient will have a follow-up appointment in 2 weeks.   Electronically Signed   By: Corrie Mckusick D.O.   On: 01/01/2014 18:55   Ir Angiogram Selective Each Additional Vessel  01/01/2014   CLINICAL DATA:  54 year old female with symptomatic uterine fibroids. The patient has been experiencing both bulk type symptoms and abnormal bleeding. She desires minimally invasive treatment, and is a good candidate for bilateral uterine artery embolization.  EXAM: UTERINE FIBROID EMBOLIZATION  ULTRASOUND GUIDED ACCESS OF THE RIGHT COMMON FEMORAL ARTERY.  DEPLOYMENT OF EXOSEAL DEVICE FOR  HEMOSTASIS.  FLUOROSCOPIC SUPERIOR HYPOGASTRIC NERVE BLOCK  ANESTHESIA/SEDATION: 4.0 Mg IV Versed; 200 mcg fentanyl  Total Moderate Sedation Time: 70  Minutes.  Contrast Volume: 65 ml  Additional Medications: 2.0 g Ancef, 60 mg toradol IM, 8 mg Zofran  FLUOROSCOPY TIME:  23 min, 30 seconds  PROCEDURE: The procedure, risks, benefits, and alternatives were explained to the patient. Questions regarding the procedure were encouraged and answered. The patient understands and consents to the procedure.  The right groin was prepped with Betadine in a sterile fashion, and a sterile drape was applied covering the operative field. A sterile gown and sterile gloves were used for the procedure. Local anesthesia was provided with 1% Lidocaine.  Ultrasound survey of the right inguinal region was performed with images stored and sent to PACs.  The skin and subcutaneous tissue generously infiltrated with 1% lidocaine for local anesthesia, and a micropuncture needle was advanced under ultrasound guidance into the right common femoral artery. With excellent blood flow returned, micro wire was advanced. The needle was removed and a micropuncture set was advanced over the micro wire. The inner dilator in the stylet were removed, and an 035 Bentson wire was advanced into the abdominal aorta. The micro sheath was removed, a 5 [place] vascular sheath was placed into the common femoral artery. The dilator was removed and the sheath was flushed.  A 5 French Cobra catheter was advanced over the Bentson wire and used to select the left common iliac artery. The Cobra catheter was advanced into the internal iliac artery.  Once the catheter was over the bifurcation of the aorta, a superior hypogastric nerve block was performed via an anterior approach by directing a 22 gauge, 15 cm Chiba needle onto the anterior surface of L5 below the bifurcation of the aorta. Once the surface the needle was in contact with the vertebral body, the stylet was  removed, small amount of contrast confirmed a prevertebral  needle location, and a solution of contrast and 0.25% bupivacaine was infused with fluoroscopy. Approximately 12 cc of bupivacaine was administered. The needle was removed.  Angiogram of the left hypogastric artery was performed for identification of the left uterine artery. Once we identified the origin, a high-flow Renegade micro catheter was advanced to the Cobra catheter with a 0.014 Fathom wire. The micro catheter and micro wire combination were used to select the uterine artery.  Angiogram was performed.  Once we confirmed position the micro catheter within the distal descending left uterine artery, embolization was performed. Embolization of the left uterine artery was performed with 1 and 2/3 vial of embospheres (500 - 700 microm). Embolization was performed to 5 beat stasis.  The the micro wire/micro catheter were removed, and the Cobra catheter and Bentson wire were used to form a Waltman loop. The Waltman loop was then used to select the ipsilateral hypogastric artery.  Angiogram was performed to confirm position and identified the origin of the urine artery. Once we confirmed the origin, the same micro catheter and micro wire combination were used to select the uterine artery. The catheter was advanced to the distal descending right uterine artery. Angiogram was performed to confirm position.  Embolization of the right uterine artery was performed with 1-1/3 vials of 500 -700 micro m embospheres. Five beat stasis was achieved.  The Waltman loop was reduced via the left common iliac artery with the assistance of the Bentson wire, and then the catheter and wire were removed.  Angiogram of the right common femoral artery was performed.  An Exoseal device was deployed for hemostasis.  The patient tolerated the procedure well and remained hemodynamically stable throughout.  No complications were encountered and no significant blood loss was  encountered.  COMPLICATIONS: None.  FINDINGS: Ultrasound survey of the right inguinal region demonstrates patent right common femoral artery.  Angiogram of the left internal iliac artery demonstrates the uterine artery has a separate origin from the anterior division. Images demonstrate placement of the micro catheter into the distal descending left urine artery for embolization.  After embolization was performed, 5 beat stasis was achieved.  Angiogram of the right hypogastric artery demonstrates the uterine artery origin has its origin from the inferior gluteal artery.  Embolization was performed from the distal descending right ureter in artery. Five beast stasis was achieved after embolization.  Angiogram of the right common femoral artery access demonstrates catheter in the center of the right common femoral artery.  Single oblique image of the L5 vertebral body demonstrates the needle tip in position within the prevertebral space and contrast stain within the prevertebral space.  IMPRESSION: Status post bilateral uterine artery embolization for symptomatic uterine fibroids.  Fluoroscopic guided superior hypogastric nerve block.  Deployment of Exoseal device for hemostasis.  Signed,  Dulcy Fanny. Earleen Newport DO  [facility]  [facility]  PLAN: The patient will be admitted for 1 night for observation.  Advanced diet as tolerated.  The patient should have her right leg straight for 3 hr given the right common femoral artery access.  The patient will be treated with anti and nausea medicine scheduled and on the main hand.  Toradol IV 30 mg Q 6 hr.  A PCA pump has been administered for breakthrough pain. DC at 6 a.m. 01/02/2014.  Hydration, with 150 cc/hour of normal saline for 8 hr.  Removal of Foley catheter in the evening, or at 6 a.m. 01/02/2014.  The patient will have a  follow-up appointment in 2 weeks.   Electronically Signed   By: Corrie Mckusick D.O.   On: 01/01/2014  18:55   Ir US Guide Vasc Access Right  01/01/2014   CLINICAL DATA:  54 year old female with symptomatic uterine fibroids. The patient has been experiencing both bulk type symptoms and abnormal bleeding. She desires minimally invasive treatment, and is a good candidate for bilateral uterine artery embolization.  EXAM: UTERINE FIBROID EMBOLIZATION  ULTRASOUND GUIDED ACCESS OF THE RIGHT COMMON FEMORAL ARTERY.  DEPLOYMENT OF EXOSEAL DEVICE FOR HEMOSTASIS.  FLUOROSCOPIC SUPERIOR HYPOGASTRIC NERVE BLOCK  ANESTHESIA/SEDATION: 4.0 Mg IV Versed; 200 mcg fentanyl  Total Moderate Sedation Time: 70  Minutes.  Contrast Volume: 65 ml  Additional Medications: 2.0 g Ancef, 60 mg toradol IM, 8 mg Zofran  FLUOROSCOPY TIME:  23 min, 30 seconds  PROCEDURE: The procedure, risks, benefits, and alternatives were explained to the patient. Questions regarding the procedure were encouraged and answered. The patient understands and consents to the procedure.  The right groin was prepped with Betadine in a sterile fashion, and a sterile drape was applied covering the operative field. A sterile gown and sterile gloves were used for the procedure. Local anesthesia was provided with 1% Lidocaine.  Ultrasound survey of the right inguinal region was performed with images stored and sent to PACs.  The skin and subcutaneous tissue generously infiltrated with 1% lidocaine for local anesthesia, and a micropuncture needle was advanced under ultrasound guidance into the right common femoral artery. With excellent blood flow returned, micro wire was advanced. The needle was removed and a micropuncture set was advanced over the micro wire. The inner dilator in the stylet were removed, and an 035 Bentson wire was advanced into the abdominal aorta. The micro sheath was removed, a 5 [place] vascular sheath was placed into the common femoral artery. The dilator was removed and the sheath was flushed.  A 5 French Cobra catheter was advanced over the Bentson  wire and used to select the left common iliac artery. The Cobra catheter was advanced into the internal iliac artery.  Once the catheter was over the bifurcation of the aorta, a superior hypogastric nerve block was performed via an anterior approach by directing a 22 gauge, 15 cm Chiba needle onto the anterior surface of L5 below the bifurcation of the aorta. Once the surface the needle was in contact with the vertebral body, the stylet was removed, small amount of contrast confirmed a prevertebral needle location, and a solution of contrast and 0.25% bupivacaine was infused with fluoroscopy. Approximately 12 cc of bupivacaine was administered. The needle was removed.  Angiogram of the left hypogastric artery was performed for identification of the left uterine artery. Once we identified the origin, a high-flow Renegade micro catheter was advanced to the Cobra catheter with a 0.014 Fathom wire. The micro catheter and micro wire combination were used to select the uterine artery.  Angiogram was performed.  Once we confirmed position the micro catheter within the distal descending left uterine artery, embolization was performed. Embolization of the left uterine artery was performed with 1 and 2/3 vial of embospheres (500 - 700 microm). Embolization was performed to 5 beat stasis.  The the micro wire/micro catheter were removed, and the Cobra catheter and Bentson wire were used to form a Waltman loop. The Waltman loop was then used to select the ipsilateral hypogastric artery.  Angiogram was performed to confirm position and identified the origin of the urine artery. Once we confirmed the origin,  the same micro catheter and micro wire combination were used to select the uterine artery. The catheter was advanced to the distal descending right uterine artery. Angiogram was performed to confirm position.  Embolization of the right uterine artery was performed with 1-1/3 vials of 500 -700 micro m embospheres. Five beat  stasis was achieved.  The Waltman loop was reduced via the left common iliac artery with the assistance of the Bentson wire, and then the catheter and wire were removed.  Angiogram of the right common femoral artery was performed.  An Exoseal device was deployed for hemostasis.  The patient tolerated the procedure well and remained hemodynamically stable throughout.  No complications were encountered and no significant blood loss was encountered.  COMPLICATIONS: None.  FINDINGS: Ultrasound survey of the right inguinal region demonstrates patent right common femoral artery.  Angiogram of the left internal iliac artery demonstrates the uterine artery has a separate origin from the anterior division. Images demonstrate placement of the micro catheter into the distal descending left urine artery for embolization.  After embolization was performed, 5 beat stasis was achieved.  Angiogram of the right hypogastric artery demonstrates the uterine artery origin has its origin from the inferior gluteal artery.  Embolization was performed from the distal descending right ureter in artery. Five beast stasis was achieved after embolization.  Angiogram of the right common femoral artery access demonstrates catheter in the center of the right common femoral artery.  Single oblique image of the L5 vertebral body demonstrates the needle tip in position within the prevertebral space and contrast stain within the prevertebral space.  IMPRESSION: Status post bilateral uterine artery embolization for symptomatic uterine fibroids.  Fluoroscopic guided superior hypogastric nerve block.  Deployment of Exoseal device for hemostasis.  Signed,  Dulcy Fanny. Earleen Newport DO  [facility]  [facility]  PLAN: The patient will be admitted for 1 night for observation.  Advanced diet as tolerated.  The patient should have her right leg straight for 3 hr given the right common femoral artery access.  The patient  will be treated with anti and nausea medicine scheduled and on the main hand.  Toradol IV 30 mg Q 6 hr.  A PCA pump has been administered for breakthrough pain. DC at 6 a.m. 01/02/2014.  Hydration, with 150 cc/hour of normal saline for 8 hr.  Removal of Foley catheter in the evening, or at 6 a.m. 01/02/2014.  The patient will have a follow-up appointment in 2 weeks.   Electronically Signed   By: Corrie Mckusick D.O.   On: 01/01/2014 18:55   Ir Fluoro Guide Ndl Plmt / Bx  01/01/2014   CLINICAL DATA:  54 year old female with symptomatic uterine fibroids. The patient has been experiencing both bulk type symptoms and abnormal bleeding. She desires minimally invasive treatment, and is a good candidate for bilateral uterine artery embolization.  EXAM: UTERINE FIBROID EMBOLIZATION  ULTRASOUND GUIDED ACCESS OF THE RIGHT COMMON FEMORAL ARTERY.  DEPLOYMENT OF EXOSEAL DEVICE FOR HEMOSTASIS.  FLUOROSCOPIC SUPERIOR HYPOGASTRIC NERVE BLOCK  ANESTHESIA/SEDATION: 4.0 Mg IV Versed; 200 mcg fentanyl  Total Moderate Sedation Time: 70  Minutes.  Contrast Volume: 65 ml  Additional Medications: 2.0 g Ancef, 60 mg toradol IM, 8 mg Zofran  FLUOROSCOPY TIME:  23 min, 30 seconds  PROCEDURE: The procedure, risks, benefits, and alternatives were explained to the patient. Questions regarding the procedure were encouraged and answered. The patient understands and consents to the procedure.  The right groin was prepped with  Betadine in a sterile fashion, and a sterile drape was applied covering the operative field. A sterile gown and sterile gloves were used for the procedure. Local anesthesia was provided with 1% Lidocaine.  Ultrasound survey of the right inguinal region was performed with images stored and sent to PACs.  The skin and subcutaneous tissue generously infiltrated with 1% lidocaine for local anesthesia, and a micropuncture needle was advanced under ultrasound guidance into the right common femoral artery. With excellent blood flow  returned, micro wire was advanced. The needle was removed and a micropuncture set was advanced over the micro wire. The inner dilator in the stylet were removed, and an 035 Bentson wire was advanced into the abdominal aorta. The micro sheath was removed, a 5 [place] vascular sheath was placed into the common femoral artery. The dilator was removed and the sheath was flushed.  A 5 French Cobra catheter was advanced over the Bentson wire and used to select the left common iliac artery. The Cobra catheter was advanced into the internal iliac artery.  Once the catheter was over the bifurcation of the aorta, a superior hypogastric nerve block was performed via an anterior approach by directing a 22 gauge, 15 cm Chiba needle onto the anterior surface of L5 below the bifurcation of the aorta. Once the surface the needle was in contact with the vertebral body, the stylet was removed, small amount of contrast confirmed a prevertebral needle location, and a solution of contrast and 0.25% bupivacaine was infused with fluoroscopy. Approximately 12 cc of bupivacaine was administered. The needle was removed.  Angiogram of the left hypogastric artery was performed for identification of the left uterine artery. Once we identified the origin, a high-flow Renegade micro catheter was advanced to the Cobra catheter with a 0.014 Fathom wire. The micro catheter and micro wire combination were used to select the uterine artery.  Angiogram was performed.  Once we confirmed position the micro catheter within the distal descending left uterine artery, embolization was performed. Embolization of the left uterine artery was performed with 1 and 2/3 vial of embospheres (500 - 700 microm). Embolization was performed to 5 beat stasis.  The the micro wire/micro catheter were removed, and the Cobra catheter and Bentson wire were used to form a Waltman loop. The Waltman loop was then used to select the ipsilateral hypogastric artery.  Angiogram was  performed to confirm position and identified the origin of the urine artery. Once we confirmed the origin, the same micro catheter and micro wire combination were used to select the uterine artery. The catheter was advanced to the distal descending right uterine artery. Angiogram was performed to confirm position.  Embolization of the right uterine artery was performed with 1-1/3 vials of 500 -700 micro m embospheres. Five beat stasis was achieved.  The Waltman loop was reduced via the left common iliac artery with the assistance of the Bentson wire, and then the catheter and wire were removed.  Angiogram of the right common femoral artery was performed.  An Exoseal device was deployed for hemostasis.  The patient tolerated the procedure well and remained hemodynamically stable throughout.  No complications were encountered and no significant blood loss was encountered.  COMPLICATIONS: None.  FINDINGS: Ultrasound survey of the right inguinal region demonstrates patent right common femoral artery.  Angiogram of the left internal iliac artery demonstrates the uterine artery has a separate origin from the anterior division. Images demonstrate placement of the micro catheter into the distal descending left urine artery  for embolization.  After embolization was performed, 5 beat stasis was achieved.  Angiogram of the right hypogastric artery demonstrates the uterine artery origin has its origin from the inferior gluteal artery.  Embolization was performed from the distal descending right ureter in artery. Five beast stasis was achieved after embolization.  Angiogram of the right common femoral artery access demonstrates catheter in the center of the right common femoral artery.  Single oblique image of the L5 vertebral body demonstrates the needle tip in position within the prevertebral space and contrast stain within the prevertebral space.  IMPRESSION: Status post bilateral uterine artery embolization for symptomatic  uterine fibroids.  Fluoroscopic guided superior hypogastric nerve block.  Deployment of Exoseal device for hemostasis.  Signed,  Dulcy Fanny. Earleen Newport DO  [facility]  [facility] Radiology  PLAN: The patient will be admitted for 1 night for observation.  Advanced diet as tolerated.  The patient should have her right leg straight for 3 hr given the right common femoral artery access.  The patient will be treated with anti and nausea medicine scheduled and on the main hand.  Toradol IV 30 mg Q 6 hr.  A PCA pump has been administered for breakthrough pain. DC at 6 a.m. 01/02/2014.  Hydration, with 150 cc/hour of normal saline for 8 hr.  Removal of Foley catheter in the evening, or at 6 a.m. 01/02/2014.  The patient will have a follow-up appointment in 2 weeks.   Electronically Signed   By: Corrie Mckusick D.O.   On: 01/01/2014 18:55   Ir Embo Tumor Organ Ischemia Infarct Inc Guide Roadmapping  01/01/2014   CLINICAL DATA:  54 year old female with symptomatic uterine fibroids. The patient has been experiencing both bulk type symptoms and abnormal bleeding. She desires minimally invasive treatment, and is a good candidate for bilateral uterine artery embolization.  EXAM: UTERINE FIBROID EMBOLIZATION  ULTRASOUND GUIDED ACCESS OF THE RIGHT COMMON FEMORAL ARTERY.  DEPLOYMENT OF EXOSEAL DEVICE FOR HEMOSTASIS.  FLUOROSCOPIC SUPERIOR HYPOGASTRIC NERVE BLOCK  ANESTHESIA/SEDATION: 4.0 Mg IV Versed; 200 mcg fentanyl  Total Moderate Sedation Time: 70  Minutes.  Contrast Volume: 65 ml  Additional Medications: 2.0 g Ancef, 60 mg toradol IM, 8 mg Zofran  FLUOROSCOPY TIME:  23 min, 30 seconds  PROCEDURE: The procedure, risks, benefits, and alternatives were explained to the patient. Questions regarding the procedure were encouraged and answered. The patient understands and consents to the procedure.  The right groin was prepped with Betadine in a sterile fashion, and a sterile drape was applied  covering the operative field. A sterile gown and sterile gloves were used for the procedure. Local anesthesia was provided with 1% Lidocaine.  Ultrasound survey of the right inguinal region was performed with images stored and sent to PACs.  The skin and subcutaneous tissue generously infiltrated with 1% lidocaine for local anesthesia, and a micropuncture needle was advanced under ultrasound guidance into the right common femoral artery. With excellent blood flow returned, micro wire was advanced. The needle was removed and a micropuncture set was advanced over the micro wire. The inner dilator in the stylet were removed, and an 035 Bentson wire was advanced into the abdominal aorta. The micro sheath was removed, a 5 [place] vascular sheath was placed into the common femoral artery. The dilator was removed and the sheath was flushed.  A 5 French Cobra catheter was advanced over the Bentson wire and used to select the left common iliac artery. The Cobra catheter was advanced into  the internal iliac artery.  Once the catheter was over the bifurcation of the aorta, a superior hypogastric nerve block was performed via an anterior approach by directing a 22 gauge, 15 cm Chiba needle onto the anterior surface of L5 below the bifurcation of the aorta. Once the surface the needle was in contact with the vertebral body, the stylet was removed, small amount of contrast confirmed a prevertebral needle location, and a solution of contrast and 0.25% bupivacaine was infused with fluoroscopy. Approximately 12 cc of bupivacaine was administered. The needle was removed.  Angiogram of the left hypogastric artery was performed for identification of the left uterine artery. Once we identified the origin, a high-flow Renegade micro catheter was advanced to the Cobra catheter with a 0.014 Fathom wire. The micro catheter and micro wire combination were used to select the uterine artery.  Angiogram was performed.  Once we confirmed position  the micro catheter within the distal descending left uterine artery, embolization was performed. Embolization of the left uterine artery was performed with 1 and 2/3 vial of embospheres (500 - 700 microm). Embolization was performed to 5 beat stasis.  The the micro wire/micro catheter were removed, and the Cobra catheter and Bentson wire were used to form a Waltman loop. The Waltman loop was then used to select the ipsilateral hypogastric artery.  Angiogram was performed to confirm position and identified the origin of the urine artery. Once we confirmed the origin, the same micro catheter and micro wire combination were used to select the uterine artery. The catheter was advanced to the distal descending right uterine artery. Angiogram was performed to confirm position.  Embolization of the right uterine artery was performed with 1-1/3 vials of 500 -700 micro m embospheres. Five beat stasis was achieved.  The Waltman loop was reduced via the left common iliac artery with the assistance of the Bentson wire, and then the catheter and wire were removed.  Angiogram of the right common femoral artery was performed.  An Exoseal device was deployed for hemostasis.  The patient tolerated the procedure well and remained hemodynamically stable throughout.  No complications were encountered and no significant blood loss was encountered.  COMPLICATIONS: None.  FINDINGS: Ultrasound survey of the right inguinal region demonstrates patent right common femoral artery.  Angiogram of the left internal iliac artery demonstrates the uterine artery has a separate origin from the anterior division. Images demonstrate placement of the micro catheter into the distal descending left urine artery for embolization.  After embolization was performed, 5 beat stasis was achieved.  Angiogram of the right hypogastric artery demonstrates the uterine artery origin has its origin from the inferior gluteal artery.  Embolization was performed from the  distal descending right ureter in artery. Five beast stasis was achieved after embolization.  Angiogram of the right common femoral artery access demonstrates catheter in the center of the right common femoral artery.  Single oblique image of the L5 vertebral body demonstrates the needle tip in position within the prevertebral space and contrast stain within the prevertebral space.  IMPRESSION: Status post bilateral uterine artery embolization for symptomatic uterine fibroids.  Fluoroscopic guided superior hypogastric nerve block.  Deployment of Exoseal device for hemostasis.  Signed,  Dulcy Fanny. Earleen Newport DO  [facility]  [facility]  PLAN: The patient will be admitted for 1 night for observation.  Advanced diet as tolerated.  The patient should have her right leg straight for 3 hr given the right common femoral artery  access.  The patient will be treated with anti and nausea medicine scheduled and on the main hand.  Toradol IV 30 mg Q 6 hr.  A PCA pump has been administered for breakthrough pain. DC at 6 a.m. 01/02/2014.  Hydration, with 150 cc/hour of normal saline for 8 hr.  Removal of Foley catheter in the evening, or at 6 a.m. 01/02/2014.  The patient will have a follow-up appointment in 2 weeks.   Electronically Signed   By: Corrie Mckusick D.O.   On: 01/01/2014 18:55    Labs:  CBC:  Recent Labs  01/01/14 0754  WBC 6.1  HGB 8.2*  HCT 29.0*  PLT 241    COAGS:  Recent Labs  01/01/14 0754  INR 1.05  APTT 27    BMP:  Recent Labs  11/12/13 1458 01/01/14 0754  NA  --  134*  K  --  3.4*  CL  --  104  CO2  --  25  GLUCOSE  --  94  BUN 14 11  CALCIUM  --  8.9  CREATININE 0.80 0.68  GFRNONAA 86 >90  GFRAA >89 >90    LIVER FUNCTION TESTS: No results for input(s): BILITOT, AST, ALT, ALKPHOS, PROT, ALBUMIN in the last 8760 hours.  TUMOR MARKERS: No results for input(s): AFPTM, CEA, CA199, CHROMGRNA in the last 8760 hours.  Assessment  and Plan:  54 yo female SP uterine artery embolization for her symptomatic uterine fibroids, with an intraoperative superior hypogastric block.  She is doing very well and seems very satisfied with the result.   She has been treated for a UTI that developed in the few days after the procedure with the help of her OB/GYN, Dr. Nelva Bush, and all her symptoms have resolved.    Her post-operative course is consistent with expectations at this time, and we will plan on seeing her back in 6 months to discuss her progression, and determine the need for further imaging at that time.      I spent a total of 30 minutes face to face in clinical consultation, greater than 50% of which was counseling/coordinating care for her treatment of symptomatic uterine fibroids with [place], and intra-operative superior hypogastric nerve block.   Signed,  Dulcy Fanny. Earleen Newport, DO     Sonya Weber 01/16/2014, 3:32 PM

## 2014-03-25 ENCOUNTER — Telehealth: Payer: Self-pay | Admitting: Radiology

## 2014-03-25 NOTE — Telephone Encounter (Signed)
3 mo s/p [place] call.  Left message requesting patient call for status update.  Kalen Neidert Riki Rusk, [place] 03/25/2014 10:43 AM

## 2014-04-16 ENCOUNTER — Telehealth: Payer: Self-pay | Admitting: *Deleted

## 2014-04-16 NOTE — Telephone Encounter (Signed)
sw Sonya Weber 3 mth post [place]. Ms Fambrough was very pleased with her procedure.She states her periods have been like night and day, periods are regular and very light.

## 2014-05-26 ENCOUNTER — Other Ambulatory Visit: Payer: Self-pay | Admitting: Interventional Radiology

## 2014-05-26 DIAGNOSIS — D259 Leiomyoma of uterus, unspecified: Secondary | ICD-10-CM

## 2014-05-26 DIAGNOSIS — N920 Excessive and frequent menstruation with regular cycle: Secondary | ICD-10-CM

## 2014-06-19 ENCOUNTER — Encounter (INDEPENDENT_AMBULATORY_CARE_PROVIDER_SITE_OTHER): Payer: Self-pay

## 2014-06-19 ENCOUNTER — Ambulatory Visit
Admission: RE | Admit: 2014-06-19 | Discharge: 2014-06-19 | Disposition: A | Discharge status: Home / Self Care | Payer: 59 | Source: Ambulatory Visit | Attending: Interventional Radiology | Admitting: Interventional Radiology

## 2014-06-19 DIAGNOSIS — D259 Leiomyoma of uterus, unspecified: Secondary | ICD-10-CM | POA: Insufficient documentation

## 2014-06-19 DIAGNOSIS — D509 Iron deficiency anemia, unspecified: Secondary | ICD-10-CM | POA: Insufficient documentation

## 2014-06-19 DIAGNOSIS — N920 Excessive and frequent menstruation with regular cycle: Secondary | ICD-10-CM

## 2014-06-19 NOTE — Progress Notes (Signed)
LMP:  approx 06/05/2014.  Menses every month x 5 days.  Light flow without clots.  Improvement re:  Urinary frequency & urgency.  Does not experience either at present.    Overall patient feels that she is doing well.  Melanie Pellot [place], RN 06/19/2014 2:08 PM

## 2014-06-19 NOTE — Progress Notes (Signed)
Chief Complaint: Chief Complaint  Patient presents with  . Follow-up    6 mo follow up [place] for symptomatic uterine fibroids    History of Present Illness: Sonya Weber is a 54 y.o. female presenting today for routine follow-up status post uterine artery embolization performed 01/01/2014 for symptomatic uterine fibroids..  Sonya Weber is very pleased with her results. Previously she had complaints of excessive bleeding with the majority of her monthly cycles resulting in significant clots and heavy bleeding. She also had complained of urinary frequency and other urinary symptoms related to the fibroids.  At this time she is happy to say that she has significantly reduced flow during her monthly menstrual cycles, which have continued to be regular every month. The amount of flow significantly decreased at this time. In addition, she is also pleased that her urinary symptoms are much improved. She has had no pain or discomfort after the procedure. She has had no difficulty with the access site of the right femoral artery.      Past Medical History  Diagnosis Date  . Anemia   . Hypertension   . Fibroids   . Hypothyroidism     Past Surgical History  Procedure Laterality Date  . Cesarean section    . Tubal ligation      Allergies: Review of patient's allergies indicates no known allergies.  Medications: Prior to Admission medications   Medication Sig Start Date End Date Taking? Authorizing Provider  amLODipine (NORVASC) 5 MG tablet Take 5 mg by mouth daily with breakfast.    Yes Historical Provider, MD  levothyroxine (SYNTHROID, LEVOTHROID) 25 MCG tablet Take 25 mcg by mouth daily before breakfast.   Yes Historical Provider, MD  olmesartan-hydrochlorothiazide (BENICAR HCT) 40-25 MG per tablet Take 1 tablet by mouth daily with breakfast.    Yes Historical Provider, MD  ipratropium (ATROVENT) 0.03 % nasal spray Place 2 sprays into the nose every 12 (twelve) hours. Patient not  taking: Reported on 01/01/2014 02/05/12   Thao P Le, DO     No family history on file.  History   Social History  . Marital Status: Divorced    Spouse Name: N/A  . Number of Children: N/A  . Years of Education: N/A   Social History Main Topics  . Smoking status: Never Smoker   . Smokeless tobacco: Not on file  . Alcohol Use: Not on file  . Drug Use: Not on file  . Sexual Activity: Not on file   Other Topics Concern  . Not on file   Social History Narrative    Review of Systems: A 12 point ROS discussed and pertinent positives are indicated in the HPI above.  All other systems are negative.  Review of Systems  Vital Signs: Ht 5\' 7"  (1.702 m)  Wt 168 lb (76.204 kg)  BMI 26.31 kg/m2  LMP 06/05/2014 (Approximate)  Physical Exam     Imaging: No results found.  Labs:  CBC:  Recent Labs  01/01/14 0754  WBC 6.1  HGB 8.2*  HCT 29.0*  PLT 241    COAGS:  Recent Labs  01/01/14 0754  INR 1.05  APTT 27    BMP:  Recent Labs  11/12/13 1458 01/01/14 0754  NA  --  134*  K  --  3.4*  CL  --  104  CO2  --  25  GLUCOSE  --  94  BUN 14 11  CALCIUM  --  8.9  CREATININE 0.80 0.68  GFRNONAA 86 >90  GFRAA >89 >90    LIVER FUNCTION TESTS: No results for input(s): BILITOT, AST, ALT, ALKPHOS, PROT, ALBUMIN in the last 8760 hours.  TUMOR MARKERS: No results for input(s): AFPTM, CEA, CA199, CHROMGRNA in the last 8760 hours.  Assessment and Plan:   [name] is 6 months status post bilateral uterine artery embolization for symptomatic uterine fibroids. She has had very pleasing results, with decreased urinary symptoms and resolution of the heavy flow she was experiencing during her monthly cycles. She is happy currently with her significantly decreased flow during her cycles. At this time there is no concern to prompt any medical imaging, and she understands she may be seen on an as-needed basis by our office. I answered all of her questions to the best of my  ability, and she understands her plan of care.    SignedCorrie Mckusick 06/19/2014, 4:52 PM   I spent a total of    15 Minutes in face to face in clinical consultation, greater than 50% of which was counseling/coordinating care for symptomatic uterine fibroids, status post treatment with bilateral uterine artery embolization and superior hypogastric nerve block.

## 2014-12-30 ENCOUNTER — Other Ambulatory Visit: Payer: Self-pay

## 2014-12-30 DIAGNOSIS — Z1231 Encounter for screening mammogram for malignant neoplasm of breast: Secondary | ICD-10-CM

## 2015-01-01 ENCOUNTER — Ambulatory Visit
Admission: RE | Admit: 2015-01-01 | Discharge: 2015-01-01 | Disposition: A | Discharge status: Home / Self Care | Payer: 59 | Source: Ambulatory Visit

## 2015-01-01 DIAGNOSIS — Z1231 Encounter for screening mammogram for malignant neoplasm of breast: Secondary | ICD-10-CM

## 2015-04-10 DIAGNOSIS — I10 Essential (primary) hypertension: Secondary | ICD-10-CM | POA: Diagnosis present

## 2016-01-06 ENCOUNTER — Encounter: Payer: Self-pay | Admitting: Interventional Radiology

## 2016-01-25 ENCOUNTER — Other Ambulatory Visit: Payer: Self-pay | Admitting: Internal Medicine

## 2016-01-25 DIAGNOSIS — Z1231 Encounter for screening mammogram for malignant neoplasm of breast: Secondary | ICD-10-CM

## 2016-02-16 ENCOUNTER — Ambulatory Visit: Payer: 59

## 2016-04-07 ENCOUNTER — Ambulatory Visit
Admission: RE | Admit: 2016-04-07 | Discharge: 2016-04-07 | Disposition: A | Discharge status: Home / Self Care | Payer: 59 | Source: Ambulatory Visit | Attending: Internal Medicine | Admitting: Internal Medicine

## 2016-04-07 DIAGNOSIS — Z1231 Encounter for screening mammogram for malignant neoplasm of breast: Secondary | ICD-10-CM

## 2016-04-08 ENCOUNTER — Other Ambulatory Visit: Payer: Self-pay | Admitting: Internal Medicine

## 2016-04-08 DIAGNOSIS — R928 Other abnormal and inconclusive findings on diagnostic imaging of breast: Secondary | ICD-10-CM

## 2016-04-25 ENCOUNTER — Ambulatory Visit
Admission: RE | Admit: 2016-04-25 | Discharge: 2016-04-25 | Disposition: A | Discharge status: Home / Self Care | Payer: 59 | Source: Ambulatory Visit | Attending: Internal Medicine | Admitting: Internal Medicine

## 2016-04-25 DIAGNOSIS — R928 Other abnormal and inconclusive findings on diagnostic imaging of breast: Secondary | ICD-10-CM

## 2017-04-25 ENCOUNTER — Other Ambulatory Visit: Payer: Self-pay | Admitting: Internal Medicine

## 2017-04-25 DIAGNOSIS — Z1231 Encounter for screening mammogram for malignant neoplasm of breast: Secondary | ICD-10-CM

## 2017-05-04 DIAGNOSIS — E559 Vitamin D deficiency, unspecified: Secondary | ICD-10-CM | POA: Diagnosis present

## 2017-05-17 ENCOUNTER — Ambulatory Visit
Admission: RE | Admit: 2017-05-17 | Discharge: 2017-05-17 | Disposition: A | Discharge status: Home / Self Care | Payer: 59 | Source: Ambulatory Visit | Attending: Internal Medicine | Admitting: Internal Medicine

## 2017-05-17 DIAGNOSIS — Z1231 Encounter for screening mammogram for malignant neoplasm of breast: Secondary | ICD-10-CM

## 2018-04-23 ENCOUNTER — Other Ambulatory Visit: Payer: Self-pay | Admitting: Internal Medicine

## 2018-04-23 DIAGNOSIS — Z1231 Encounter for screening mammogram for malignant neoplasm of breast: Secondary | ICD-10-CM

## 2018-06-11 DIAGNOSIS — R0683 Snoring: Secondary | ICD-10-CM | POA: Insufficient documentation

## 2018-06-15 ENCOUNTER — Ambulatory Visit: Payer: 59

## 2018-06-28 ENCOUNTER — Ambulatory Visit
Admission: RE | Admit: 2018-06-28 | Discharge: 2018-06-28 | Disposition: A | Discharge status: Home / Self Care | Payer: 59 | Source: Ambulatory Visit | Attending: Internal Medicine | Admitting: Internal Medicine

## 2018-06-28 ENCOUNTER — Other Ambulatory Visit: Payer: Self-pay

## 2018-06-28 DIAGNOSIS — Z1231 Encounter for screening mammogram for malignant neoplasm of breast: Secondary | ICD-10-CM

## 2018-08-31 DIAGNOSIS — G4733 Obstructive sleep apnea (adult) (pediatric): Secondary | ICD-10-CM | POA: Diagnosis present

## 2019-05-15 ENCOUNTER — Other Ambulatory Visit: Payer: Self-pay | Admitting: Internal Medicine

## 2019-05-15 DIAGNOSIS — Z1231 Encounter for screening mammogram for malignant neoplasm of breast: Secondary | ICD-10-CM

## 2019-07-01 ENCOUNTER — Other Ambulatory Visit: Payer: Self-pay

## 2019-07-01 ENCOUNTER — Ambulatory Visit
Admission: RE | Admit: 2019-07-01 | Discharge: 2019-07-01 | Disposition: A | Discharge status: Home / Self Care | Payer: 59 | Source: Ambulatory Visit | Attending: Internal Medicine | Admitting: Internal Medicine

## 2019-07-01 DIAGNOSIS — Z1231 Encounter for screening mammogram for malignant neoplasm of breast: Secondary | ICD-10-CM

## 2020-06-08 ENCOUNTER — Other Ambulatory Visit: Payer: Self-pay | Admitting: Internal Medicine

## 2020-06-08 DIAGNOSIS — Z1231 Encounter for screening mammogram for malignant neoplasm of breast: Secondary | ICD-10-CM

## 2020-07-08 ENCOUNTER — Ambulatory Visit
Admission: RE | Admit: 2020-07-08 | Discharge: 2020-07-08 | Disposition: A | Discharge status: Home / Self Care | Payer: POS | Source: Ambulatory Visit | Attending: Internal Medicine | Admitting: Internal Medicine

## 2020-07-08 ENCOUNTER — Other Ambulatory Visit: Payer: Self-pay

## 2020-07-08 DIAGNOSIS — Z1231 Encounter for screening mammogram for malignant neoplasm of breast: Secondary | ICD-10-CM

## 2021-06-18 ENCOUNTER — Other Ambulatory Visit: Payer: Self-pay | Admitting: Internal Medicine

## 2021-06-18 DIAGNOSIS — Z1231 Encounter for screening mammogram for malignant neoplasm of breast: Secondary | ICD-10-CM

## 2021-07-09 ENCOUNTER — Ambulatory Visit
Admission: RE | Admit: 2021-07-09 | Discharge: 2021-07-09 | Disposition: A | Discharge status: Home / Self Care | Payer: POS | Source: Ambulatory Visit | Attending: Internal Medicine | Admitting: Internal Medicine

## 2021-07-09 DIAGNOSIS — Z1231 Encounter for screening mammogram for malignant neoplasm of breast: Secondary | ICD-10-CM

## 2021-07-12 ENCOUNTER — Other Ambulatory Visit: Payer: Self-pay | Admitting: Internal Medicine

## 2021-07-12 DIAGNOSIS — R928 Other abnormal and inconclusive findings on diagnostic imaging of breast: Secondary | ICD-10-CM

## 2021-07-21 ENCOUNTER — Other Ambulatory Visit: Payer: Self-pay | Admitting: Internal Medicine

## 2021-07-21 ENCOUNTER — Ambulatory Visit
Admission: RE | Admit: 2021-07-21 | Discharge: 2021-07-21 | Disposition: A | Discharge status: Home / Self Care | Payer: POS | Source: Ambulatory Visit | Attending: Internal Medicine | Admitting: Internal Medicine

## 2021-07-21 DIAGNOSIS — R928 Other abnormal and inconclusive findings on diagnostic imaging of breast: Secondary | ICD-10-CM

## 2021-07-21 DIAGNOSIS — N6313 Unspecified lump in the right breast, lower outer quadrant: Secondary | ICD-10-CM

## 2021-07-26 ENCOUNTER — Ambulatory Visit
Admission: RE | Admit: 2021-07-26 | Discharge: 2021-07-26 | Disposition: A | Discharge status: Home / Self Care | Payer: POS | Source: Ambulatory Visit | Attending: Internal Medicine | Admitting: Internal Medicine

## 2021-07-26 DIAGNOSIS — N6313 Unspecified lump in the right breast, lower outer quadrant: Secondary | ICD-10-CM

## 2021-08-23 ENCOUNTER — Ambulatory Visit: Payer: Self-pay | Admitting: Surgery

## 2021-08-23 DIAGNOSIS — N6489 Other specified disorders of breast: Secondary | ICD-10-CM

## 2021-08-24 ENCOUNTER — Other Ambulatory Visit: Payer: Self-pay | Admitting: Surgery

## 2021-08-24 DIAGNOSIS — N6489 Other specified disorders of breast: Secondary | ICD-10-CM

## 2021-09-13 ENCOUNTER — Encounter (HOSPITAL_BASED_OUTPATIENT_CLINIC_OR_DEPARTMENT_OTHER): Payer: Self-pay | Admitting: Surgery

## 2021-09-20 ENCOUNTER — Encounter (HOSPITAL_BASED_OUTPATIENT_CLINIC_OR_DEPARTMENT_OTHER)
Admission: RE | Admit: 2021-09-20 | Discharge: 2021-09-20 | Disposition: A | Discharge status: Home / Self Care | Payer: POS | Source: Ambulatory Visit | Attending: Surgery | Admitting: Surgery

## 2021-09-20 ENCOUNTER — Ambulatory Visit
Admission: RE | Admit: 2021-09-20 | Discharge: 2021-09-20 | Disposition: A | Discharge status: Home / Self Care | Payer: POS | Source: Ambulatory Visit | Attending: Surgery | Admitting: Surgery

## 2021-09-20 DIAGNOSIS — Z01812 Encounter for preprocedural laboratory examination: Secondary | ICD-10-CM | POA: Diagnosis not present

## 2021-09-20 DIAGNOSIS — I10 Essential (primary) hypertension: Secondary | ICD-10-CM | POA: Diagnosis not present

## 2021-09-20 DIAGNOSIS — N6021 Fibroadenosis of right breast: Secondary | ICD-10-CM | POA: Diagnosis not present

## 2021-09-20 DIAGNOSIS — N6489 Other specified disorders of breast: Secondary | ICD-10-CM | POA: Diagnosis present

## 2021-09-20 DIAGNOSIS — N6041 Mammary duct ectasia of right breast: Secondary | ICD-10-CM | POA: Diagnosis not present

## 2021-09-20 DIAGNOSIS — E039 Hypothyroidism, unspecified: Secondary | ICD-10-CM | POA: Diagnosis not present

## 2021-09-20 DIAGNOSIS — N6011 Diffuse cystic mastopathy of right breast: Secondary | ICD-10-CM | POA: Diagnosis not present

## 2021-09-20 LAB — POCT PREGNANCY, URINE: Preg Test, Ur: NEGATIVE

## 2021-09-20 NOTE — Progress Notes (Signed)

## 2021-09-21 ENCOUNTER — Other Ambulatory Visit: Payer: Self-pay

## 2021-09-21 ENCOUNTER — Encounter (HOSPITAL_BASED_OUTPATIENT_CLINIC_OR_DEPARTMENT_OTHER)
Admission: RE | Disposition: A | Discharge status: Home / Self Care | Payer: Self-pay | Source: Ambulatory Visit | Attending: Surgery

## 2021-09-21 ENCOUNTER — Ambulatory Visit (HOSPITAL_BASED_OUTPATIENT_CLINIC_OR_DEPARTMENT_OTHER): Payer: POS | Admitting: Anesthesiology

## 2021-09-21 ENCOUNTER — Ambulatory Visit
Admission: RE | Admit: 2021-09-21 | Discharge: 2021-09-21 | Disposition: A | Discharge status: Home / Self Care | Payer: POS | Source: Ambulatory Visit | Attending: Surgery | Admitting: Surgery

## 2021-09-21 ENCOUNTER — Ambulatory Visit (HOSPITAL_BASED_OUTPATIENT_CLINIC_OR_DEPARTMENT_OTHER)
Admission: RE | Admit: 2021-09-21 | Discharge: 2021-09-21 | Disposition: A | Discharge status: Home / Self Care | Payer: POS | Source: Ambulatory Visit | Attending: Surgery | Admitting: Surgery

## 2021-09-21 ENCOUNTER — Encounter (HOSPITAL_BASED_OUTPATIENT_CLINIC_OR_DEPARTMENT_OTHER): Payer: Self-pay | Admitting: Surgery

## 2021-09-21 DIAGNOSIS — N6011 Diffuse cystic mastopathy of right breast: Secondary | ICD-10-CM | POA: Diagnosis not present

## 2021-09-21 DIAGNOSIS — N6489 Other specified disorders of breast: Secondary | ICD-10-CM

## 2021-09-21 DIAGNOSIS — N920 Excessive and frequent menstruation with regular cycle: Secondary | ICD-10-CM

## 2021-09-21 DIAGNOSIS — I10 Essential (primary) hypertension: Secondary | ICD-10-CM | POA: Insufficient documentation

## 2021-09-21 DIAGNOSIS — N6021 Fibroadenosis of right breast: Secondary | ICD-10-CM | POA: Insufficient documentation

## 2021-09-21 DIAGNOSIS — N6041 Mammary duct ectasia of right breast: Secondary | ICD-10-CM | POA: Insufficient documentation

## 2021-09-21 DIAGNOSIS — E039 Hypothyroidism, unspecified: Secondary | ICD-10-CM | POA: Insufficient documentation

## 2021-09-21 HISTORY — PX: BREAST LUMPECTOMY WITH RADIOACTIVE SEED LOCALIZATION: SHX6424

## 2021-09-21 SURGERY — BREAST LUMPECTOMY WITH RADIOACTIVE SEED LOCALIZATION
Anesthesia: General | Site: Breast | Laterality: Right

## 2021-09-21 MED ORDER — EPHEDRINE SULFATE (PRESSORS) 50 MG/ML IJ SOLN
INTRAMUSCULAR | Status: DC | PRN
  Administered 2021-09-21 (×2): 10 mg via INTRAVENOUS

## 2021-09-21 MED ORDER — PROPOFOL 10 MG/ML IV BOLUS
INTRAVENOUS | Status: DC | PRN
  Administered 2021-09-21: 200 mg via INTRAVENOUS

## 2021-09-21 MED ORDER — FENTANYL CITRATE (PF) 100 MCG/2ML IJ SOLN: 25.0000 ug | INTRAMUSCULAR | Status: DC | PRN

## 2021-09-21 MED ORDER — MIDAZOLAM HCL 2 MG/2ML IJ SOLN
INTRAMUSCULAR | Status: AC
  Filled 2021-09-21: qty 2

## 2021-09-21 MED ORDER — LIDOCAINE 2% (20 MG/ML) 5 ML SYRINGE
INTRAMUSCULAR | Status: AC
  Filled 2021-09-21: qty 5

## 2021-09-21 MED ORDER — OXYCODONE HCL 5 MG/5ML PO SOLN: 5.0000 mg | Freq: Once | ORAL | Status: AC | PRN

## 2021-09-21 MED ORDER — SODIUM CHLORIDE 0.9 % IV SOLN
INTRAVENOUS | Status: DC | PRN
  Administered 2021-09-21: 500 mL

## 2021-09-21 MED ORDER — ACETAMINOPHEN 500 MG PO TABS
1000.0000 mg | ORAL_TABLET | ORAL | Status: AC
  Administered 2021-09-21: 1000 mg via ORAL

## 2021-09-21 MED ORDER — KETOROLAC TROMETHAMINE 30 MG/ML IJ SOLN: 30.0000 mg | Freq: Once | INTRAMUSCULAR | Status: DC | PRN

## 2021-09-21 MED ORDER — LACTATED RINGERS IV SOLN: INTRAVENOUS | Status: DC

## 2021-09-21 MED ORDER — ONDANSETRON HCL 4 MG/2ML IJ SOLN
INTRAMUSCULAR | Status: AC
  Filled 2021-09-21: qty 2

## 2021-09-21 MED ORDER — PHENYLEPHRINE HCL (PRESSORS) 10 MG/ML IV SOLN
INTRAVENOUS | Status: DC | PRN
  Administered 2021-09-21 (×2): 80 ug via INTRAVENOUS

## 2021-09-21 MED ORDER — DEXAMETHASONE SODIUM PHOSPHATE 4 MG/ML IJ SOLN
INTRAMUSCULAR | Status: DC | PRN
  Administered 2021-09-21: 4 mg via INTRAVENOUS

## 2021-09-21 MED ORDER — MIDAZOLAM HCL 5 MG/5ML IJ SOLN
INTRAMUSCULAR | Status: DC | PRN
  Administered 2021-09-21: 2 mg via INTRAVENOUS

## 2021-09-21 MED ORDER — FENTANYL CITRATE (PF) 100 MCG/2ML IJ SOLN
INTRAMUSCULAR | Status: DC | PRN
  Administered 2021-09-21: 25 ug via INTRAVENOUS
  Administered 2021-09-21: 50 ug via INTRAVENOUS

## 2021-09-21 MED ORDER — CEFAZOLIN SODIUM-DEXTROSE 2-4 GM/100ML-% IV SOLN
2.0000 g | INTRAVENOUS | Status: AC
  Administered 2021-09-21: 2 g via INTRAVENOUS

## 2021-09-21 MED ORDER — BUPIVACAINE-EPINEPHRINE (PF) 0.25% -1:200000 IJ SOLN
INTRAMUSCULAR | Status: DC | PRN
  Administered 2021-09-21: 16 mL via PERINEURAL

## 2021-09-21 MED ORDER — SODIUM CHLORIDE 0.9 % IV SOLN
INTRAVENOUS | Status: AC
  Filled 2021-09-21: qty 10

## 2021-09-21 MED ORDER — DEXAMETHASONE SODIUM PHOSPHATE 10 MG/ML IJ SOLN
INTRAMUSCULAR | Status: AC
  Filled 2021-09-21: qty 1

## 2021-09-21 MED ORDER — ONDANSETRON HCL 4 MG/2ML IJ SOLN
INTRAMUSCULAR | Status: DC | PRN
  Administered 2021-09-21: 4 mg via INTRAVENOUS

## 2021-09-21 MED ORDER — OXYCODONE HCL 5 MG PO TABS
ORAL_TABLET | ORAL | Status: AC
  Filled 2021-09-21: qty 1

## 2021-09-21 MED ORDER — ONDANSETRON HCL 4 MG/2ML IJ SOLN: 4.0000 mg | Freq: Once | INTRAMUSCULAR | Status: DC | PRN

## 2021-09-21 MED ORDER — CHLORHEXIDINE GLUCONATE CLOTH 2 % EX PADS: 6.0000 | MEDICATED_PAD | Freq: Once | CUTANEOUS | Status: DC

## 2021-09-21 MED ORDER — PROPOFOL 10 MG/ML IV BOLUS
INTRAVENOUS | Status: AC
  Filled 2021-09-21: qty 20

## 2021-09-21 MED ORDER — CEFAZOLIN SODIUM-DEXTROSE 2-4 GM/100ML-% IV SOLN
INTRAVENOUS | Status: AC
  Filled 2021-09-21: qty 100

## 2021-09-21 MED ORDER — ACETAMINOPHEN 500 MG PO TABS
ORAL_TABLET | ORAL | Status: AC
  Filled 2021-09-21: qty 2

## 2021-09-21 MED ORDER — OXYCODONE HCL 5 MG PO TABS: 5.0000 mg | ORAL_TABLET | Freq: Four times a day (QID) | ORAL | 0 refills | Status: DC | PRN

## 2021-09-21 MED ORDER — FENTANYL CITRATE (PF) 100 MCG/2ML IJ SOLN
INTRAMUSCULAR | Status: AC
  Filled 2021-09-21: qty 2

## 2021-09-21 MED ORDER — OXYCODONE HCL 5 MG PO TABS
5.0000 mg | ORAL_TABLET | Freq: Once | ORAL | Status: AC | PRN
  Administered 2021-09-21: 5 mg via ORAL

## 2021-09-21 MED ORDER — LIDOCAINE 2% (20 MG/ML) 5 ML SYRINGE
INTRAMUSCULAR | Status: DC | PRN
  Administered 2021-09-21: 100 mg via INTRAVENOUS

## 2021-09-21 SURGICAL SUPPLY — 49 items
ADH SKN CLS APL DERMABOND .7 (GAUZE/BANDAGES/DRESSINGS) ×1
APL PRP STRL LF DISP 70% ISPRP (MISCELLANEOUS) ×1
APPLIER CLIP 9.375 MED OPEN (MISCELLANEOUS)
APR CLP MED 9.3 20 MLT OPN (MISCELLANEOUS)
BINDER BREAST LRG (GAUZE/BANDAGES/DRESSINGS) IMPLANT
BINDER BREAST MEDIUM (GAUZE/BANDAGES/DRESSINGS) IMPLANT
BINDER BREAST XLRG (GAUZE/BANDAGES/DRESSINGS) IMPLANT
BINDER BREAST XXLRG (GAUZE/BANDAGES/DRESSINGS) IMPLANT
BLADE SURG 15 STRL LF DISP TIS (BLADE) ×1 IMPLANT
BLADE SURG 15 STRL SS (BLADE) ×1
CANISTER SUC SOCK COL 7IN (MISCELLANEOUS) IMPLANT
CANISTER SUCT 1200ML W/VALVE (MISCELLANEOUS) IMPLANT
CHLORAPREP W/TINT 26 (MISCELLANEOUS) ×1 IMPLANT
CLIP APPLIE 9.375 MED OPEN (MISCELLANEOUS) IMPLANT
COVER BACK TABLE 60X90IN (DRAPES) ×1 IMPLANT
COVER MAYO STAND STRL (DRAPES) ×1 IMPLANT
COVER PROBE W GEL 5X96 (DRAPES) ×1 IMPLANT
DERMABOND ADVANCED .7 DNX12 (GAUZE/BANDAGES/DRESSINGS) ×1 IMPLANT
DRAPE LAPAROSCOPIC ABDOMINAL (DRAPES) IMPLANT
DRAPE LAPAROTOMY 100X72 PEDS (DRAPES) ×1 IMPLANT
DRAPE UTILITY XL STRL (DRAPES) ×1 IMPLANT
ELECT COATED BLADE 2.86 ST (ELECTRODE) ×1 IMPLANT
ELECT REM PT RETURN 9FT ADLT (ELECTROSURGICAL) ×1
ELECTRODE REM PT RTRN 9FT ADLT (ELECTROSURGICAL) ×1 IMPLANT
GLOVE BIOGEL PI IND STRL 8 (GLOVE) ×1 IMPLANT
GLOVE ECLIPSE 8.0 STRL XLNG CF (GLOVE) ×1 IMPLANT
GOWN STRL REUS W/ TWL LRG LVL3 (GOWN DISPOSABLE) ×2 IMPLANT
GOWN STRL REUS W/ TWL XL LVL3 (GOWN DISPOSABLE) ×1 IMPLANT
GOWN STRL REUS W/TWL LRG LVL3 (GOWN DISPOSABLE) ×2
GOWN STRL REUS W/TWL XL LVL3 (GOWN DISPOSABLE) ×1
HEMOSTAT ARISTA ABSORB 3G PWDR (HEMOSTASIS) IMPLANT
HEMOSTAT SNOW SURGICEL 2X4 (HEMOSTASIS) IMPLANT
KIT MARKER MARGIN INK (KITS) ×1 IMPLANT
NDL HYPO 25X1 1.5 SAFETY (NEEDLE) ×1 IMPLANT
NEEDLE HYPO 25X1 1.5 SAFETY (NEEDLE) ×1 IMPLANT
NS IRRIG 1000ML POUR BTL (IV SOLUTION) ×1 IMPLANT
PACK BASIN DAY SURGERY FS (CUSTOM PROCEDURE TRAY) ×1 IMPLANT
PENCIL SMOKE EVACUATOR (MISCELLANEOUS) ×1 IMPLANT
SLEEVE SCD COMPRESS KNEE MED (STOCKING) ×1 IMPLANT
SPIKE FLUID TRANSFER (MISCELLANEOUS) IMPLANT
SPONGE T-LAP 4X18 ~~LOC~~+RFID (SPONGE) ×1 IMPLANT
SUT MNCRL AB 4-0 PS2 18 (SUTURE) ×1 IMPLANT
SUT SILK 2 0 SH (SUTURE) IMPLANT
SUT VICRYL 3-0 CR8 SH (SUTURE) ×1 IMPLANT
SYR CONTROL 10ML LL (SYRINGE) ×1 IMPLANT
TOWEL GREEN STERILE FF (TOWEL DISPOSABLE) ×1 IMPLANT
TRAY FAXITRON CT DISP (TRAY / TRAY PROCEDURE) ×1 IMPLANT
TUBE CONNECTING 20X1/4 (TUBING) IMPLANT
YANKAUER SUCT BULB TIP NO VENT (SUCTIONS) IMPLANT

## 2021-09-21 NOTE — Transfer of Care (Signed)
Immediate Anesthesia Transfer of Care Note  Patient: Sonya Weber  Procedure(s) Performed: RIGHT BREAST LUMPECTOMY WITH RADIOACTIVE SEED LOCALIZATION (Right: Breast)  Patient Location: PACU  Anesthesia Type:General  Level of Consciousness: sedated  Airway & Oxygen Therapy: Patient Spontanous Breathing and Patient connected to face mask oxygen  Post-op Assessment: Report given to RN and Post -op Vital signs reviewed and stable  Post vital signs: Reviewed and stable  Last Vitals:  Vitals Value Taken Time  BP 115/76 09/21/21 1527  Temp    Pulse 60 09/21/21 1528  Resp 15 09/21/21 1528  SpO2 100 % 09/21/21 1528  Vitals shown include unvalidated device data.  Last Pain:  Vitals:   09/21/21 1243  TempSrc: Oral  PainSc: 0-No pain         Complications: No notable events documented.

## 2021-09-21 NOTE — H&P (Signed)
History of Present Illness: Sonya Weber is a 61 y.o. female who is seen today as an office consultation for evaluation of Breast Mass .   Patient sent for evaluation of abnormal mammogram. She was noted to have a density right breast lower outer quadrant. Core biopsy showed radial scar. She presents today for discussion of management. She has no history of breast cancer or breast problems in her family. This was her first biopsy. No history of breast pain, nipple discharge or mass.  Review of Systems: A complete review of systems was obtained from the patient. I have reviewed this information and discussed as appropriate with the patient. See HPI as well for other ROS.    Medical History: Past Medical History:  Diagnosis Date  Anemia  GERD (gastroesophageal reflux disease)  Hypertension  Sleep apnea  Thyroid disease   There is no problem list on file for this patient.  History reviewed. No pertinent surgical history.   No Known Allergies  Current Outpatient Medications on File Prior to Visit  Medication Sig Dispense Refill  amLODIPine (NORVASC) 5 MG tablet Take 5 mg by mouth once daily  carvediloL (COREG) 25 MG tablet Take by mouth  levothyroxine (SYNTHROID) 25 MCG tablet Take by mouth  olmesartan-hydroCHLOROthiazide (BENICAR HCT) 40-25 mg tablet Take 1 tablet by mouth once daily   No current facility-administered medications on file prior to visit.   History reviewed. No pertinent family history.   Social History   Tobacco Use  Smoking Status Never  Smokeless Tobacco Never    Social History   Socioeconomic History  Marital status: Divorced  Tobacco Use  Smoking status: Never  Smokeless tobacco: Never   Objective:   Vitals:  08/23/21 1129  BP: 122/72  Pulse: 77  Weight: 82.7 kg (182 lb 6.4 oz)  Height: 170.2 cm ('5\' 7"'$ )   Body mass index is 28.57 kg/m.  Physical Exam HENT:  Head: Normocephalic.  Eyes:  Pupils: Pupils are equal, round, and reactive to  light.  Cardiovascular:  Rate and Rhythm: Normal rate.  Pulmonary:  Effort: Pulmonary effort is normal.  Breath sounds: No stridor.  Chest:  Breasts: Right: Normal. No swelling, bleeding, mass or nipple discharge.  Left: Normal. No swelling, bleeding, mass or nipple discharge.  Musculoskeletal:  General: Normal range of motion.  Cervical back: Normal range of motion.  Lymphadenopathy:  Upper Body:  Right upper body: No axillary adenopathy.  Left upper body: No axillary adenopathy.  Skin: General: Skin is warm.  Neurological:  General: No focal deficit present.  Mental Status: She is alert.  Psychiatric:  Mood and Affect: Mood normal.  Behavior: Behavior normal.    Labs, Imaging and Diagnostic Testing:  Right digital diagnostic mammography and breast tomosynthesis was performed. The images were evaluated with computer-aided detection.; Targeted ultrasound examination of the right breast was performed  COMPARISON: Previous exam(s).  ACR Breast Density Category c: The breast tissue is heterogeneously dense, which may obscure small masses.  FINDINGS: The focal asymmetry in the inferior right breast persists on additional imaging. There is distortion associated with the asymmetry. The distortion is most discretely identified on the MLO view, along the superior aspect of the focal asymmetry.  On physical exam, no suspicious lumps are identified.  Targeted ultrasound is performed, showing no discrete sonographic correlate for the right breast focal asymmetry/distortion. No adenopathy.  IMPRESSION: Persistent right breast focal asymmetry/distortion only discretely identified mammographically. No adenopathy.  RECOMMENDATION: Recommend stereotactic biopsy of the right breast focal asymmetry/distortion. Recommend targeting  the distortion specifically.  I have discussed the findings and recommendations with the patient. If applicable, a reminder letter will be sent to  the patient regarding the next appointment.  BI-RADS CATEGORY 4: Suspicious.  Diagnosis Breast, right, needle core biopsy, lower inner, X clip - COMPLEX SCLEROSING LESION WITH USUAL DUCTAL HYPERPLASIA, FIBROCYSTIC CHANGE, ADENOSIS, APOCRINE METAPLASIA AND A SMALL ASSOCIATED INTRADUCTAL PAPILLOMA. - MICROCALCIFICATIONS PRESENT.  Assessment and Plan:   Diagnoses and all orders for this visit:  Radial scar of right breast    Discussed the pros and cons of lumpectomy. Discussed potential upgrade risk of these lesions after lumpectomy to be in the range of 5 to 10%. She wishes to proceed with right breast seed localized lumpectomy for radial scar.The procedure has been discussed with the patient. Alternatives to surgery have been discussed with the patient. Risks of surgery include bleeding, Infection, Seroma formation, death, and the need for further surgery. The patient understands and wishes to proceed.   No follow-ups on file.  Kennieth Francois, MD

## 2021-09-21 NOTE — Discharge Instructions (Addendum)
Post Anesthesia Home Care Instructions  Activity: Get plenty of rest for the remainder of the day. A responsible individual must stay with you for 24 hours following the procedure.  For the next 24 hours, DO NOT: -Drive a car -Paediatric nurse -Drink alcoholic beverages -Take any medication unless instructed by your physician -Make any legal decisions or sign important papers.  Meals: Start with liquid foods such as gelatin or soup. Progress to regular foods as tolerated. Avoid greasy, spicy, heavy foods. If nausea and/or vomiting occur, drink only clear liquids until the nausea and/or vomiting subsides. Call your physician if vomiting continues.  Special Instructions/Symptoms: Your throat may feel dry or sore from the anesthesia or the breathing tube placed in your throat during surgery. If this causes discomfort, gargle with warm salt water. The discomfort should disappear within 24 hours.  If you had a scopolamine patch placed behind your ear for the management of post- operative nausea and/or vomiting:  1. The medication in the patch is effective for 72 hours, after which it should be removed.  Wrap patch in a tissue and discard in the trash. Wash hands thoroughly with soap and water. 2. You may remove the patch earlier than 72 hours if you experience unpleasant side effects which may include dry mouth, dizziness or visual disturbances. 3. Avoid touching the patch. Wash your hands with soap and water after contact with the patch.      Next dose of Tylenol can be given at 7:00pm if needed.     [place] Office Phone Number [phone]  BREAST BIOPSY/ PARTIAL MASTECTOMY: POST OP INSTRUCTIONS  Always review your discharge instruction sheet given to you by the facility where your surgery was performed.  IF YOU HAVE DISABILITY OR FAMILY LEAVE FORMS, YOU MUST BRING THEM TO THE OFFICE FOR PROCESSING.  DO NOT GIVE THEM TO YOUR DOCTOR.  A prescription for pain  medication may be given to you upon discharge.  Take your pain medication as prescribed, if needed.  If narcotic pain medicine is not needed, then you may take acetaminophen (Tylenol) or ibuprofen (Advil) as needed. Take your usually prescribed medications unless otherwise directed If you need a refill on your pain medication, please contact your pharmacy.  They will contact our office to request authorization.  Prescriptions will not be filled after 5pm or on week-ends. You should eat very light the first 24 hours after surgery, such as soup, crackers, pudding, etc.  Resume your normal diet the day after surgery. Most patients will experience some swelling and bruising in the breast.  Ice packs and a good support bra will help.  Swelling and bruising can take several days to resolve.  It is common to experience some constipation if taking pain medication after surgery.  Increasing fluid intake and taking a stool softener will usually help or prevent this problem from occurring.  A mild laxative (Milk of Magnesia or Miralax) should be taken according to package directions if there are no bowel movements after 48 hours. Unless discharge instructions indicate otherwise, you may remove your bandages 24-48 hours after surgery, and you may shower at that time.  You may have steri-strips (small skin tapes) in place directly over the incision.  These strips should be left on the skin for 7-10 days.  If your surgeon used skin glue on the incision, you may shower in 24 hours.  The glue will flake off over the next 2-3 weeks.  Any sutures or staples will be removed  at the office during your follow-up visit. ACTIVITIES:  You may resume regular daily activities (gradually increasing) beginning the next day.  Wearing a good support bra or sports bra minimizes pain and swelling.  You may have sexual intercourse when it is comfortable. You may drive when you no longer are taking prescription pain medication, you can  comfortably wear a seatbelt, and you can safely maneuver your car and apply brakes. RETURN TO WORK:  ______________________________________________________________________________________ Dennis Bast should see your doctor in the office for a follow-up appointment approximately two weeks after your surgery.  Your doctor's nurse will typically make your follow-up appointment when she calls you with your pathology report.  Expect your pathology report 2-3 business days after your surgery.  You may call to check if you do not hear from [place] after three days. OTHER INSTRUCTIONS: _______________________________________________________________________________________________ _____________________________________________________________________________________________________________________________________ _____________________________________________________________________________________________________________________________________ _____________________________________________________________________________________________________________________________________  WHEN TO CALL YOUR DOCTOR: Fever over 101.0 Nausea and/or vomiting. Extreme swelling or bruising. Continued bleeding from incision. Increased pain, redness, or drainage from the incision.  The clinic staff is available to answer your questions during regular business hours.  Please don't hesitate to call and ask to speak to one of the nurses for clinical concerns.  If you have a medical emergency, go to the nearest emergency room or call 911.  A surgeon from [facility] is always on call at the hospital.  For further questions, please visit [email or web address]

## 2021-09-21 NOTE — Anesthesia Postprocedure Evaluation (Signed)
Anesthesia Post Note  Patient: Sonya Weber  Procedure(s) Performed: RIGHT BREAST LUMPECTOMY WITH RADIOACTIVE SEED LOCALIZATION (Right: Breast)     Patient location during evaluation: PACU Anesthesia Type: General Level of consciousness: awake and alert Pain management: pain level controlled Vital Signs Assessment: post-procedure vital signs reviewed and stable Respiratory status: spontaneous breathing, nonlabored ventilation, respiratory function stable and patient connected to nasal cannula oxygen Cardiovascular status: blood pressure returned to baseline and stable Postop Assessment: no apparent nausea or vomiting Anesthetic complications: no   No notable events documented.  Last Vitals:  Vitals:   09/21/21 1545 09/21/21 1551  BP: 137/84 (!) 140/91  Pulse: 62 60  Resp: 13 13  Temp:    SpO2: 96% 98%    Last Pain:  Vitals:   09/21/21 1551  TempSrc:   PainSc: 0-No pain                 Monroe Qin S

## 2021-09-21 NOTE — Op Note (Signed)
Preoperative diagnosis: Right breast radial scar  Postop diagnosis: Same  Procedure: Right breast seed localized lumpectomy  Surgeon: Erroll Luna, MD  Anesthesia: LMA with 0.25% Marcaine with epinephrine  EBL: Minimal  Specimen: Right breast tissue with seed and clip verified by Faxitron  Drains: None  Indications for procedure: The patient is a 61 year old female with a right breast mammographic abnormality.  Core biopsy showed radial scar.  She was seen in consultation we discussed options of observation versus excision.  We discussed potential upgrade risk of 10% in these lesions.  She opted to proceed with right breast seed localized lumpectomy.The procedure has been discussed with the patient. Alternatives to surgery have been discussed with the patient.  Risks of surgery include bleeding,  Infection,  Seroma formation, death,  and the need for further surgery.   The patient understands and wishes to proceed.     Description of procedure: The patient was met in the holding area and questions were answered.  Seed was placed as an outpatient.  Right breast was marked as correct site.  She was taken back to the operating room.  She was placed upon upon the OR table.  After induction of general esthesia, the right breast was prepped and draped in a sterile fashion and timeout performed.  Neoprobe used to identify the seed in the lower inner quadrant.  An incision was made along the inframammary crease below it.  Dissection was carried more superior and all tissue and the seed and clip were excised with a grossly negative margin.  The wound was made hemostatic with cautery.  Local anesthetic was infiltrated.  Hemostasis achieved.  The incision was closed with a deep layer 3-0 Vicryl.  4 Monocryl was used to close the skin in a subcuticular fashion.  Dermabond was applied.  All counts found to be correct.  Breast binder placed.  Patient was then awoke extubated taken recovery in satisfactory  condition.

## 2021-09-21 NOTE — Interval H&P Note (Signed)
History and Physical Interval Note:  09/21/2021 2:20 PM  Sonya Weber  has presented today for surgery, with the diagnosis of [place].  The various methods of treatment have been discussed with the patient and family. After consideration of risks, benefits and other options for treatment, the patient has consented to  Procedure(s): RIGHT BREAST LUMPECTOMY WITH RADIOACTIVE SEED LOCALIZATION (Right) as a surgical intervention.  The patient's history has been reviewed, patient examined, no change in status, stable for surgery.  I have reviewed the patient's chart and labs.  Questions were answered to the patient's satisfaction.   The procedure has been discussed with the patient. Alternatives to surgery have been discussed with the patient.  Risks of surgery include bleeding,  Infection,  Seroma formation, death,  and the need for further surgery.   The patient understands and wishes to proceed.   Turner Daniels MD

## 2021-09-21 NOTE — Anesthesia Procedure Notes (Signed)
Procedure Name: LMA Insertion Date/Time: 09/21/2021 2:45 PM  Performed by: Maryella Shivers, CRNAPre-anesthesia Checklist: Patient identified, Emergency Drugs available, Suction available and Patient being monitored Patient Re-evaluated:Patient Re-evaluated prior to induction Oxygen Delivery Method: Circle system utilized Preoxygenation: Pre-oxygenation with 100% oxygen Induction Type: IV induction Ventilation: Mask ventilation without difficulty LMA: LMA inserted LMA Size: 4.0 Number of attempts: 1 Airway Equipment and Method: Bite block Placement Confirmation: positive ETCO2 Tube secured with: Tape Dental Injury: Teeth and Oropharynx as per pre-operative assessment

## 2021-09-21 NOTE — Anesthesia Preprocedure Evaluation (Signed)
Anesthesia Evaluation  Patient identified by MRN, date of birth, ID band Patient awake    Reviewed: Allergy & Precautions, NPO status , Patient's Chart, lab work & pertinent test results  Airway Mallampati: II  TM Distance: >3 FB Neck ROM: Full    Dental no notable dental hx.    Pulmonary neg pulmonary ROS,    Pulmonary exam normal breath sounds clear to auscultation       Cardiovascular hypertension, Pt. on medications Normal cardiovascular exam Rhythm:Regular Rate:Normal     Neuro/Psych negative neurological ROS  negative psych ROS   GI/Hepatic negative GI ROS, Neg liver ROS,   Endo/Other  Hypothyroidism   Renal/GU negative Renal ROS  negative genitourinary   Musculoskeletal negative musculoskeletal ROS (+)   Abdominal   Peds negative pediatric ROS (+)  Hematology negative hematology ROS (+)   Anesthesia Other Findings   Reproductive/Obstetrics negative OB ROS                             Anesthesia Physical Anesthesia Plan  ASA: 2  Anesthesia Plan: General   Post-op Pain Management: Minimal or no pain anticipated   Induction: Intravenous  PONV Risk Score and Plan: 3 and Ondansetron, Dexamethasone and Treatment may vary due to age or medical condition  Airway Management Planned: LMA  Additional Equipment:   Intra-op Plan:   Post-operative Plan: Extubation in OR  Informed Consent: I have reviewed the patients History and Physical, chart, labs and discussed the procedure including the risks, benefits and alternatives for the proposed anesthesia with the patient or authorized representative who has indicated his/her understanding and acceptance.     Dental advisory given  Plan Discussed with: CRNA and Surgeon  Anesthesia Plan Comments:         Anesthesia Quick Evaluation

## 2021-09-22 ENCOUNTER — Encounter (HOSPITAL_BASED_OUTPATIENT_CLINIC_OR_DEPARTMENT_OTHER): Payer: Self-pay | Admitting: Surgery

## 2021-09-27 ENCOUNTER — Encounter: Payer: Self-pay | Admitting: Surgery

## 2021-09-27 LAB — SURGICAL PATHOLOGY

## 2021-10-12 ENCOUNTER — Encounter (HOSPITAL_COMMUNITY): Payer: Self-pay

## 2021-12-22 ENCOUNTER — Other Ambulatory Visit: Payer: Self-pay | Admitting: Surgery

## 2021-12-22 DIAGNOSIS — Z1239 Encounter for other screening for malignant neoplasm of breast: Secondary | ICD-10-CM

## 2022-01-13 ENCOUNTER — Other Ambulatory Visit: Payer: POS

## 2022-02-01 ENCOUNTER — Observation Stay (HOSPITAL_BASED_OUTPATIENT_CLINIC_OR_DEPARTMENT_OTHER)
Admission: EM | Admit: 2022-02-01 | Discharge: 2022-02-03 | Disposition: A | Discharge status: Home / Self Care | Payer: Commercial Managed Care - PPO | Attending: Internal Medicine | Admitting: Internal Medicine

## 2022-02-01 ENCOUNTER — Observation Stay (HOSPITAL_COMMUNITY): Payer: Commercial Managed Care - PPO

## 2022-02-01 ENCOUNTER — Emergency Department (HOSPITAL_BASED_OUTPATIENT_CLINIC_OR_DEPARTMENT_OTHER): Payer: Commercial Managed Care - PPO

## 2022-02-01 ENCOUNTER — Other Ambulatory Visit: Payer: Self-pay

## 2022-02-01 ENCOUNTER — Encounter (HOSPITAL_BASED_OUTPATIENT_CLINIC_OR_DEPARTMENT_OTHER): Payer: Self-pay

## 2022-02-01 DIAGNOSIS — G4733 Obstructive sleep apnea (adult) (pediatric): Secondary | ICD-10-CM | POA: Diagnosis present

## 2022-02-01 DIAGNOSIS — E039 Hypothyroidism, unspecified: Secondary | ICD-10-CM | POA: Insufficient documentation

## 2022-02-01 DIAGNOSIS — Z79899 Other long term (current) drug therapy: Secondary | ICD-10-CM | POA: Insufficient documentation

## 2022-02-01 DIAGNOSIS — I10 Essential (primary) hypertension: Secondary | ICD-10-CM | POA: Diagnosis present

## 2022-02-01 DIAGNOSIS — E559 Vitamin D deficiency, unspecified: Secondary | ICD-10-CM | POA: Diagnosis present

## 2022-02-01 DIAGNOSIS — R55 Syncope and collapse: Principal | ICD-10-CM | POA: Diagnosis present

## 2022-02-01 DIAGNOSIS — E041 Nontoxic single thyroid nodule: Secondary | ICD-10-CM | POA: Diagnosis not present

## 2022-02-01 LAB — BASIC METABOLIC PANEL
Anion gap: 7 (ref 5–15)
BUN: 21 mg/dL — ABNORMAL HIGH (ref 6–20)
CO2: 29 mmol/L (ref 22–32)
Calcium: 9.3 mg/dL (ref 8.9–10.3)
Chloride: 101 mmol/L (ref 98–111)
Creatinine, Ser: 1.04 mg/dL — ABNORMAL HIGH (ref 0.44–1.00)
GFR, Estimated: >60 mL/min (ref >60)
Glucose, Bld: 108 mg/dL — ABNORMAL HIGH (ref 70–99)
Potassium: 3.6 mmol/L (ref 3.5–5.1)
Sodium: 137 mmol/L (ref 135–145)

## 2022-02-01 LAB — CBC
HCT: 37.8 % (ref 36.0–46.0)
Hemoglobin: 12.1 g/dL (ref 12.0–15.0)
MCH: 26.5 pg (ref 26.0–34.0)
MCHC: 32 g/dL (ref 30.0–36.0)
MCV: 82.7 fL (ref 80.0–100.0)
Platelets: 201 10*3/uL (ref 150–400)
RBC: 4.57 MIL/uL (ref 3.87–5.11)
RDW: 15.6 % — ABNORMAL HIGH (ref 11.5–15.5)
WBC: 5.9 10*3/uL (ref 4.0–10.5)
nRBC: 0 % (ref 0.0–0.2)

## 2022-02-01 LAB — TROPONIN I (HIGH SENSITIVITY)
Troponin I (High Sensitivity): 3 ng/L (ref <18)
Troponin I (High Sensitivity): 2 ng/L (ref <18)

## 2022-02-01 LAB — TSH: TSH: 2.791 u[IU]/mL (ref 0.350–4.500)

## 2022-02-01 MED ORDER — LEVOTHYROXINE SODIUM 50 MCG PO TABS
50.0000 ug | ORAL_TABLET | Freq: Every day | ORAL | Status: DC
  Administered 2022-02-02 – 2022-02-03 (×2): 50 ug via ORAL
  Filled 2022-02-01 (×2): qty 1

## 2022-02-01 MED ORDER — ENOXAPARIN SODIUM 40 MG/0.4ML IJ SOSY
40.0000 mg | PREFILLED_SYRINGE | INTRAMUSCULAR | Status: DC
  Administered 2022-02-01 – 2022-02-02 (×2): 40 mg via SUBCUTANEOUS
  Filled 2022-02-01 (×2): qty 0.4

## 2022-02-01 MED ORDER — SODIUM CHLORIDE 0.9% FLUSH
3.0000 mL | Freq: Two times a day (BID) | INTRAVENOUS | Status: DC
  Administered 2022-02-01 – 2022-02-03 (×4): 3 mL via INTRAVENOUS

## 2022-02-01 MED ORDER — LEVOTHYROXINE SODIUM 25 MCG PO TABS: 25.0000 ug | ORAL_TABLET | Freq: Every day | ORAL | Status: DC

## 2022-02-01 MED ORDER — ACETAMINOPHEN 325 MG PO TABS: 650.0000 mg | ORAL_TABLET | Freq: Four times a day (QID) | ORAL | Status: DC | PRN

## 2022-02-01 MED ORDER — IOHEXOL 350 MG/ML SOLN
100.0000 mL | Freq: Once | INTRAVENOUS | Status: AC | PRN
  Administered 2022-02-01: 100 mL via INTRAVENOUS

## 2022-02-01 MED ORDER — SODIUM CHLORIDE 0.9 % IV SOLN: INTRAVENOUS | Status: AC

## 2022-02-01 MED ORDER — PROCHLORPERAZINE EDISYLATE 10 MG/2ML IJ SOLN: 10.0000 mg | Freq: Four times a day (QID) | INTRAMUSCULAR | Status: DC | PRN

## 2022-02-01 MED ORDER — FAMOTIDINE 20 MG PO TABS
20.0000 mg | ORAL_TABLET | Freq: Two times a day (BID) | ORAL | Status: DC
  Administered 2022-02-01 – 2022-02-03 (×4): 20 mg via ORAL
  Filled 2022-02-01 (×4): qty 1

## 2022-02-01 MED ORDER — ACETAMINOPHEN 650 MG RE SUPP: 650.0000 mg | Freq: Four times a day (QID) | RECTAL | Status: DC | PRN

## 2022-02-01 MED ORDER — ASPIRIN 325 MG PO TBEC
325.0000 mg | DELAYED_RELEASE_TABLET | Freq: Once | ORAL | Status: AC
  Administered 2022-02-01: 325 mg via ORAL
  Filled 2022-02-01: qty 1

## 2022-02-01 MED ORDER — ENOXAPARIN SODIUM 40 MG/0.4ML IJ SOSY: 40.0000 mg | PREFILLED_SYRINGE | INTRAMUSCULAR | Status: DC

## 2022-02-01 MED ORDER — POTASSIUM CHLORIDE CRYS ER 20 MEQ PO TBCR
40.0000 meq | EXTENDED_RELEASE_TABLET | Freq: Once | ORAL | Status: AC
  Administered 2022-02-01: 40 meq via ORAL
  Filled 2022-02-01: qty 2

## 2022-02-01 NOTE — ED Notes (Signed)
Ambulatory to [facility].

## 2022-02-01 NOTE — ED Notes (Signed)
Called [facility] for Transport talked to Kim at [employer]

## 2022-02-01 NOTE — ED Notes (Signed)
Pts computer down in room given verbal permission to transfere

## 2022-02-01 NOTE — ED Triage Notes (Signed)
States in the past 3 months has had 3 syncopal episodes, last one was yesterday. Gets a pain in left side of her neck, becomes nauseous and dizzy then passes out. States was at a store yesterday and leaned against wall and then woke up on the floor. Denies hitting head/injuries. Denies chest pain.

## 2022-02-01 NOTE — ED Provider Notes (Signed)
[facility] Provider Note   CSN: [id number] Arrival date & time: 02/01/22  [id number]     History  Chief Complaint  Patient presents with   Loss of Consciousness    Sonya Weber is a 62 y.o. female with overall noncontributory past medical history other than hypertension, hypothyroidism who presents with concern for syncopal episode yesterday with 3 near syncopal episodes in the last 3 months.  Patient reports that they have all occurred while she is at the container store, bending over looking into the pins.  Patient reports that she was craning her neck or bending her neck, she started to have a sharp pain that she described as "pulling" on the left side of the neck, became nauseous, dizzy and then passed out.  She is not sure how long she passed out for.  She reports that she felt somewhat out of it/woozy after the fact the rest of the day yesterday but feels fine this morning.  She denies any chest pain, shortness of breath, fever, chills.    Loss of Consciousness      Home Medications Prior to Admission medications   Medication Sig Start Date End Date Taking? Authorizing Provider  amLODipine (NORVASC) 5 MG tablet Take 5 mg by mouth daily with breakfast.     [provider]  ipratropium (ATROVENT) 0.03 % nasal spray Place 2 sprays into the nose every 12 (twelve) hours. Patient not taking: Reported on 01/01/2014 02/05/12   Le, Thao P, DO  levothyroxine (SYNTHROID, LEVOTHROID) 25 MCG tablet Take 25 mcg by mouth daily before breakfast.    [provider]  olmesartan-hydrochlorothiazide (BENICAR HCT) 40-25 MG per tablet Take 1 tablet by mouth daily with breakfast.     [provider]  oxyCODONE (OXY IR/ROXICODONE) 5 MG immediate release tablet Take 1 tablet (5 mg total) by mouth every 6 (six) hours as needed for severe pain. 09/21/21   Erroll Luna, MD      Allergies    Patient has no known allergies.    Review  of Systems   Review of Systems  Cardiovascular:  Positive for syncope.  Musculoskeletal:  Positive for neck pain.  All other systems reviewed and are negative.   Physical Exam Updated Vital Signs BP 125/87   Pulse 67   Temp 98.2 F (36.8 C) (Oral)   Resp 15   Ht 5' 7"$  (1.702 m)   Wt 77.6 kg   SpO2 99%   BMI 26.78 kg/m  Physical Exam Vitals and nursing note reviewed.  Constitutional:      General: She is not in acute distress.    Appearance: Normal appearance.  HENT:     Head: Normocephalic and atraumatic.  Eyes:     General:        Right eye: No discharge.        Left eye: No discharge.  Neck:     Comments: No carotid bruit bilaterally Cardiovascular:     Rate and Rhythm: Normal rate and regular rhythm.     Heart sounds: No murmur heard.    No friction rub. No gallop.  Pulmonary:     Effort: Pulmonary effort is normal.     Breath sounds: Normal breath sounds.  Abdominal:     General: Bowel sounds are normal.     Palpations: Abdomen is soft.  Musculoskeletal:     Cervical back: Normal range of motion. No rigidity.  Skin:    General: Skin is  warm and dry.     Capillary Refill: Capillary refill takes less than 2 seconds.  Neurological:     Mental Status: She is alert and oriented to person, place, and time.     Comments: Cranial nerves II through XII grossly intact.  Intact finger-nose, intact heel-to-shin.  Romberg negative, gait normal.  Alert and oriented x3.  Moves all 4 limbs spontaneously, normal coordination.  No pronator drift.  Intact strength 5 out of 5 bilateral upper and lower extremities.  Psychiatric:        Mood and Affect: Mood normal.        Behavior: Behavior normal.     ED Results / Procedures / Treatments   Labs (all labs ordered are listed, but only abnormal results are displayed) Labs Reviewed  CBC - Abnormal; Notable for the following components:      Result Value   RDW 15.6 (*)    All other components within normal limits  BASIC  METABOLIC PANEL - Abnormal; Notable for the following components:   Glucose, Bld 108 (*)    BUN 21 (*)    Creatinine, Ser 1.04 (*)    All other components within normal limits  TSH  TROPONIN I (HIGH SENSITIVITY)  TROPONIN I (HIGH SENSITIVITY)    EKG EKG Interpretation  Date/Time:  Tuesday February 01 2022 08:37:01 EST Ventricular Rate:  73 PR Interval:  194 QRS Duration: 91 QT Interval:  377 QTC Calculation: 416 R Axis:   22 Text Interpretation: Sinus rhythm Confirmed by Cindee Lame [phone]) on 02/01/2022 9:20:40 AM  Radiology CT ANGIO HEAD NECK W WO CM  Result Date: 02/01/2022 CLINICAL DATA:  Stroke/TIA, determine embolic source EXAM: CT ANGIOGRAPHY HEAD AND NECK TECHNIQUE: Multidetector CT imaging of the head and neck was performed using the standard protocol during bolus administration of intravenous contrast. Multiplanar CT image reconstructions and MIPs were obtained to evaluate the vascular anatomy. Carotid stenosis measurements (when applicable) are obtained utilizing NASCET criteria, using the distal internal carotid diameter as the denominator. RADIATION DOSE REDUCTION: This exam was performed according to the departmental dose-optimization program which includes automated exposure control, adjustment of the mA and/or kV according to patient size and/or use of iterative reconstruction technique. CONTRAST:  127m OMNIPAQUE IOHEXOL 350 MG/ML SOLN COMPARISON:  None Available. FINDINGS: CT HEAD FINDINGS Brain: No evidence of acute infarction, hemorrhage, hydrocephalus, extra-axial collection or intraparenchymal mass lesion/mass effect. Small (9 mm) calcified lesion along the high right frontal convexity without significant mass effect, likely a meningioma. Vascular: See below. Skull: No acute fracture. Sinuses/Orbits: Clear.  No acute orbital findings. Other: No mastoid effusions. Review of the MIP images confirms the above findings CTA NECK FINDINGS Aortic arch: Great vessel origins are  patent without significant stenosis. 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 Left carotid system: Atherosclerosis at the carotid bifurcation without greater than 50% stenosis. Vertebral arteries: Co dominant. Both vertebral arteries are patent without significant (greater than 50%) stenosis. Skeleton: No acute findings on limited assessment. Other neck: Heterogeneous enlarged thyroid gland with multiple nodules. Upper chest: Visualized lung apices are clear. Review of the MIP images confirms the above findings CTA HEAD FINDINGS Anterior circulation: Bilateral intracranial ICAs are patent with mild atherosclerosis. Bilateral MCAs and ACAs are patent. 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 Posterior circulation: Bilateral intradural vertebral arteries no basilar artery and bilateral posterior cerebral arteries  are patent without proximal hemodynamically significant stenosis. Venous sinuses: As permitted by contrast timing, patent. Review of the MIP images confirms the above findings IMPRESSION: CT head: 1. No evidence of acute intracranial abnormality. 2. Small (9 mm) calcified lesion along the high right frontal convexity without significant mass effect, likely a meningioma. CTA: 1. No emergent large vessel occlusion. 2. 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 3. Heterogeneous and enlarged thyroid gland with multiple nodules. Recommend thyroid US (ref: J Am Coll Radiol. 2015 Feb;12(2): 143-50). Electronically Signed   By: Margaretha Sheffield M.D.   On: 02/01/2022 10:58    Procedures Procedures    Medications Ordered in ED Medications  iohexol (OMNIPAQUE) 350 MG/ML injection 100 mL (100 mLs Intravenous Contrast Given 02/01/22 1031)    ED Course/ Medical Decision Making/ A&P Clinical Course as of 02/01/22 1533  Tue Feb 01, 2022  1207  MRI, EEG, Echo Questionable reading Tele cardiac [CP]    Clinical Course User Index [CP] Anselmo Pickler, PA-C                             Medical Decision Making Amount and/or Complexity of Data Reviewed Labs: ordered. Radiology: ordered.  Risk Prescription drug management. Decision regarding hospitalization.   This patient is a 62 y.o. female  who presents to the ED for concern of syncope, near syncopal episodes.   Differential diagnoses prior to evaluation: The emergent differential diagnosis includes, but is not limited to,  CVA, ACS, arrhythmia, vasovagal syncope, orthostatic hypotension, sepsis, hypoglycemia, electrolyte disturbance, respiratory failure, symptomatic anemia, dehydration, heat injury, polypharmacy, malignancy, anxiety/panic attack, consider vertebrobasilar syndrome, or other cardiac or neurologic syncope. This is not an exhaustive differential.   Past Medical History / Co-morbidities: Previous history of anemia, hypertension, fibroids, hypothyroidism  Additional history: Chart reviewed. Pertinent results include: Reviewed lab work, imaging from previous surgical admission for right breast lumpectomy  Physical Exam: Physical exam performed. The pertinent findings include: patient with no focal neurologic deficits, she has no carotid bruit bilaterally.  Normal heart and lung sounds throughout.  She is not syncopal or hypotensive in the emergency department.  Lab Tests/Imaging studies: I personally interpreted labs/imaging and the pertinent results include: BMP is overall unremarkable, mildly elevated BUN, creatinine but no significant AKI.  Troponin negative x 2 in context of no active chest pain, CBC is unremarkable, no clinically significant anemia, thrombocytopenia or leukocytosis..  Independently interpreted CT angio head and neck with without contrast which shows some stenosis of left M2 MCA vessel, no acute infarct, or other vertebral basilar  abnormality I agree with the radiologist interpretation.  Cardiac monitoring: EKG obtained and interpreted by my attending physician which shows: Normal sinus rhythm   Consultations: Spoke with Dr. Rory Percy with neurology who after review of imaging studies, suggests possible over read of the severe stenosis noted on CTA, he reports that it is more mild in nature, but either way should not be contributing to any syncopal symptoms, however based on description and consistent story of prior to syncopal episodes he does recommend a syncopal workup with echo, EEG, MRI during admission.   Disposition: After consideration of the diagnostic results and the patients response to treatment, I feel that patient would benefit from MRI, EEG, echo, and admission, I spoke with Dr. Lorin Mercy, the hospitalist, who after review of patient's lab work, imaging agrees to admission at this time.   emergency department workup does not suggest an  emergent condition requiring admission or immediate intervention beyond what has been performed at this time. The plan is: as above. The patient is safe for discharge and has been instructed to return immediately for worsening symptoms, change in symptoms or any other concerns.  Final Clinical Impression(s) / ED Diagnoses Final diagnoses:  Syncope and collapse    Rx / DC Orders ED Discharge Orders     None         Anselmo Pickler, PA-C 02/01/22 1533    Audley Hose, MD 02/18/22 2016

## 2022-02-01 NOTE — Progress Notes (Signed)
    Received a phone call from Facility: [facility]  Requesting MD: [name]   Patient with h/o HTN and hypothyroidism presenting with syncope.  Every time she bends over, gets shapr sensation in her neck, then light-headed and nauseated.  Last night with witnessed syncope.  CTA head and neck and troponin negative.  Neurology recommends Echo, MRI, and EEG.   Plan of care: Will observe at [facility] or [facility] with syncope evaluation including Echo, MRI, EEG.  Neurology should be consulted only if testing is abnormal; discussed with Dr. Rory Percy.     Nursing staff, Please call the [employer] number at the top of Amion at the time of the patient's arrival so that the patient can be paged to the admitting physician.    Author: Karmen Bongo, MD 02/01/2022 12:57 PM  For on call review [email or web address].

## 2022-02-01 NOTE — ED Provider Notes (Signed)
Patient is a 62 y.o. female with h/o HTN, anemia, hypothyroidism, fibroids, who p/w 3 episodes presyncope in the last 3 months, and yesterday she did lose consciousness. Always preceded by a "pulling" sensation and pain in the left side of her neck. Gets nauseated, dizzy/lightheaded, and then yesterday she actually passed out. Denies hitting her head. Did not lose bladder/bowel or bite her tongue. No h/o seizures. No h/o syncope before this.  Denies any associated chest pain shortness of breath fever/chills cough lower extreme edema abdominal pain vomiting diarrhea constipation or urinary symptoms.  Patient does not take any blood thinners and has not had any recent hospitalizations or surgeries, takes no hormones.  She currently feels well and at her baseline.  Physical exam with normal cardiac exam, normal respirations, no carotid bruits on auscultation, no lower extremity edema or erythema, equal pulses in all 4 extremities, neurologically intact.  Differential for this patient for her syncope includes possible cerebral or neurologic cause such as vertebrobasilar insufficiency, carotid dissection, vertebral artery dissection or stenosis.  She is focally neuro intact on exam and lower concern for ischemic or hemorrhagic CVA. No palpitations/CP/SOB, lower c/f cardiovascular cause.  CTA H&N demonstrates stenosis of R M2 branch, will talk w/ neurology. Neuro says possible overread, probably not contributing to syncope, though does warrant a w/u including EEG/MRI/echo. Patient admitted to hospitalist.   Audley Hose, MD 02/18/22 2015

## 2022-02-01 NOTE — H&P (Signed)
History and Physical    Patient: Sonya Weber:[id number] DOB: April 18, 1960 DOA: 02/01/2022 DOS: the patient was seen and examined on 02/01/2022 PCP: [name], DO  Patient coming from: Home  Chief Complaint:  Chief Complaint  Patient presents with   Loss of Consciousness   HPI: Sonya Weber is a 62 y.o. female with medical history significant of iron deficiency anemia, fibroids, hypertension, hypothyroidism, OSA, vitamin D deficiency who presented to the emergency department with a history of having 3 syncopal episodes in the last 3 months most recent 1 was yesterday while she was at the store yesterday around 1100.  The patient stated that she gets pain on her left sided neck associated with diaphoresis, nausea and dizziness followed by LOC.  In the last 3 months, she has had 2 other episodes like this, but without LOC.  They have not happened in the morning.  She usually takes her blood pressure medications around 0700.  No chest pain, palpitations, PND, orthopnea or pitting edema of the lower extremities.  No fever, chills or night sweats. No sore throat, rhinorrhea, dyspnea, wheezing or hemoptysis.  No appetite changes, abdominal pain, diarrhea, constipation, melena or hematochezia.  No flank pain, dysuria, frequency or hematuria.  No polyuria, polydipsia, polyphagia or blurred vision.  Lab work: CBC 0 white count 5.9, hemoglobin 12.1 g/dL platelets 201.  Troponin x 2 normal.  TSH unremarkable.  BMP showed normal electrolytes with a glucose of 108, BUN 21 and creatinine 1.04 mg/dL.  Imaging: CTA head and neck with no evidence of acute intracranial abnormality there was a small 9 mm calcified lesion along the high right frontal convexity without significant mass effect, likely meningioma.  No emergent large vessel occlusion.  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  There is heterogeneous and enlarged thyroid gland with multiple nodules with recommendation to obtain  thyroid ultrasound.  ED course: Initial vital signs were temperature 99 F, pulse 75, respiration 18, BP 127/85 mmHg O2 sat 95% on room air.   Review of Systems: As mentioned in the history of present illness. All other systems reviewed and are negative.  Past Medical History:  Diagnosis Date   Anemia    Fibroids    Hypertension    Hypothyroidism    Past Surgical History:  Procedure Laterality Date   BREAST LUMPECTOMY WITH RADIOACTIVE SEED LOCALIZATION Right 09/21/2021   Procedure: RIGHT BREAST LUMPECTOMY WITH RADIOACTIVE SEED LOCALIZATION;  Surgeon: Erroll Luna, MD;  Location: [facility];  Service: General;  Laterality: Right;   CESAREAN SECTION     IR GENERIC HISTORICAL  01/16/2014   IR RADIOLOGIST EVAL & MGMT 01/16/2014 Corrie Mckusick, DO [facility] INTERV RAD   TUBAL LIGATION     Social History:  reports that she has never smoked. She does not have any smokeless tobacco history on file. She reports current alcohol use. She reports that she does not use drugs.  No Known Allergies  Family History  Problem Relation Age of Onset   Breast cancer Neg Hx     Prior to Admission medications   Medication Sig Start Date End Date Taking? Authorizing Provider  amLODipine (NORVASC) 5 MG tablet Take 5 mg by mouth daily with breakfast.     [provider]  ipratropium (ATROVENT) 0.03 % nasal spray Place 2 sprays into the nose every 12 (twelve) hours. Patient not taking: Reported on 01/01/2014 02/05/12   Le, Thao P, DO  levothyroxine (SYNTHROID, LEVOTHROID) 25  MCG tablet Take 25 mcg by mouth daily before breakfast.    [provider]  olmesartan-hydrochlorothiazide (BENICAR HCT) 40-25 MG per tablet Take 1 tablet by mouth daily with breakfast.     [provider]  oxyCODONE (OXY IR/ROXICODONE) 5 MG immediate release tablet Take 1 tablet (5 mg total) by mouth every 6 (six) hours as needed for severe pain. 09/21/21   Erroll Luna, MD    Physical  Exam: Vitals:   02/01/22 1045 02/01/22 1115 02/01/22 1237 02/01/22 1652  BP: 128/73 125/87  110/70  Pulse: 72 67  68  Resp: '14 15  18  '$ Temp:   98.2 F (36.8 C) 98.3 F (36.8 C)  TempSrc:   Oral Oral  SpO2: 100% 99%  100%  Weight:      Height:       Physical Exam Vitals and nursing note reviewed.  Constitutional:      General: She is awake. She is not in acute distress.    Appearance: Normal appearance.  HENT:     Head: Normocephalic.     Nose: No rhinorrhea.     Mouth/Throat:     Mouth: Mucous membranes are moist.  Eyes:     General: No scleral icterus.    Pupils: Pupils are equal, round, and reactive to light.  Neck:     Vascular: No JVD.  Cardiovascular:     Rate and Rhythm: Normal rate and regular rhythm.     Heart sounds: S1 normal and S2 normal.  Pulmonary:     Effort: Pulmonary effort is normal.     Breath sounds: Normal breath sounds. No wheezing, rhonchi or rales.  Abdominal:     General: Bowel sounds are normal. There is no distension.     Palpations: Abdomen is soft.     Tenderness: There is no abdominal tenderness. There is no guarding.  Musculoskeletal:     Cervical back: Neck supple.     Right lower leg: No edema.     Left lower leg: No edema.  Skin:    General: Skin is warm and dry.  Neurological:     General: No focal deficit present.     Mental Status: She is alert and oriented to person, place, and time.     Cranial Nerves: No cranial nerve deficit.     Sensory: No sensory deficit.     Motor: No weakness.     Coordination: Coordination normal.     Gait: Gait normal.  Psychiatric:        Behavior: Behavior is cooperative.     Data Reviewed:  Results are pending, will review when available.  Assessment and Plan: Principal Problem:   Syncope and collapse Observation/telemetry. Continue IV fluids. Hold antihypertensives for now. Optimize electrolytes. Check EEG. Check echocardiogram. Obtain MRI of the brain. Will discuss with  neurology and/or cardiology as needed.  Active Problems:   Benign essential HTN Holding antihypertensives now. Monitor blood pressure.   Thyroid nodules Continue levothyroxine 25 mcg p.o. daily. Obtain thyroid ultrasound per radiology recommendation.    OSA (obstructive sleep apnea) Currently not on CPAP.    Vitamin D deficiency Advised to take supplementation, particularly in the winter.     Advance Care Planning:   Code Status: Full Code   Consults:   Family Communication:   Severity of Illness: The appropriate patient status for this patient is OBSERVATION. Observation status is judged to be reasonable and necessary in order to provide the required intensity of service to  ensure the patient's safety. The patient's presenting symptoms, physical exam findings, and initial radiographic and laboratory data in the context of their medical condition is felt to place them at decreased risk for further clinical deterioration. Furthermore, it is anticipated that the patient will be medically stable for discharge from the hospital within 2 midnights of admission.   Author: Reubin Milan, MD 02/01/2022 5:06 PM  For on call review [email or web address].   This document was prepared using Dragon voice recognition software and may contain some unintended transcription errors.

## 2022-02-02 ENCOUNTER — Observation Stay (HOSPITAL_COMMUNITY)
Admit: 2022-02-02 | Discharge: 2022-02-02 | Disposition: A | Discharge status: Home / Self Care | Payer: Commercial Managed Care - PPO | Attending: Internal Medicine | Admitting: Internal Medicine

## 2022-02-02 ENCOUNTER — Observation Stay (HOSPITAL_BASED_OUTPATIENT_CLINIC_OR_DEPARTMENT_OTHER): Payer: Commercial Managed Care - PPO

## 2022-02-02 DIAGNOSIS — E559 Vitamin D deficiency, unspecified: Secondary | ICD-10-CM

## 2022-02-02 DIAGNOSIS — E041 Nontoxic single thyroid nodule: Secondary | ICD-10-CM | POA: Diagnosis not present

## 2022-02-02 DIAGNOSIS — R55 Syncope and collapse: Secondary | ICD-10-CM

## 2022-02-02 DIAGNOSIS — I10 Essential (primary) hypertension: Secondary | ICD-10-CM | POA: Diagnosis not present

## 2022-02-02 DIAGNOSIS — G4733 Obstructive sleep apnea (adult) (pediatric): Secondary | ICD-10-CM

## 2022-02-02 LAB — ECHOCARDIOGRAM COMPLETE
Area-P 1/2: 2.31 cm2
Height: 67 in
S' Lateral: 2 cm
Weight: 2736 oz

## 2022-02-02 LAB — COMPREHENSIVE METABOLIC PANEL
ALT: 16 U/L (ref 0–44)
AST: 17 U/L (ref 15–41)
Albumin: 3.8 g/dL (ref 3.5–5.0)
Alkaline Phosphatase: 57 U/L (ref 38–126)
Anion gap: 9 (ref 5–15)
BUN: 21 mg/dL — ABNORMAL HIGH (ref 6–20)
CO2: 27 mmol/L (ref 22–32)
Calcium: 8.9 mg/dL (ref 8.9–10.3)
Chloride: 101 mmol/L (ref 98–111)
Creatinine, Ser: 1.09 mg/dL — ABNORMAL HIGH (ref 0.44–1.00)
GFR, Estimated: 59 mL/min — ABNORMAL LOW (ref >60)
Glucose, Bld: 97 mg/dL (ref 70–99)
Potassium: 3.7 mmol/L (ref 3.5–5.1)
Sodium: 137 mmol/L (ref 135–145)
Total Bilirubin: 0.7 mg/dL (ref 0.3–1.2)
Total Protein: 7.9 g/dL (ref 6.5–8.1)

## 2022-02-02 LAB — HIV ANTIBODY (ROUTINE TESTING W REFLEX): HIV Screen 4th Generation wRfx: NONREACTIVE

## 2022-02-02 LAB — CBC
HCT: 36.7 % (ref 36.0–46.0)
Hemoglobin: 11.3 g/dL — ABNORMAL LOW (ref 12.0–15.0)
MCH: 26.4 pg (ref 26.0–34.0)
MCHC: 30.8 g/dL (ref 30.0–36.0)
MCV: 85.7 fL (ref 80.0–100.0)
Platelets: 166 10*3/uL (ref 150–400)
RBC: 4.28 MIL/uL (ref 3.87–5.11)
RDW: 15.7 % — ABNORMAL HIGH (ref 11.5–15.5)
WBC: 5.8 10*3/uL (ref 4.0–10.5)
nRBC: 0 % (ref 0.0–0.2)

## 2022-02-02 LAB — MAGNESIUM: Magnesium: 2.1 mg/dL (ref 1.7–2.4)

## 2022-02-02 LAB — GLUCOSE, CAPILLARY: Glucose-Capillary: 109 mg/dL — ABNORMAL HIGH (ref 70–99)

## 2022-02-02 MED ORDER — SODIUM CHLORIDE 0.9 % IV SOLN: INTRAVENOUS | Status: DC

## 2022-02-02 NOTE — Procedures (Signed)
Patient Name: Sonya Weber  MRN: [id number]  Epilepsy Attending: Lora Havens  Referring Physician/Provider: Reubin Milan, MD  Date: 02/02/2022 Duration: 21.48 mins  Patient history: 62 year old female with syncope. EEG to evaluate for seizure.  Level of alertness: Awake, asleep  AEDs during EEG study: None  Technical aspects: This EEG study was done with scalp electrodes positioned according to the 10-20 International system of electrode placement. Electrical activity was reviewed with band pass filter of 1-'70Hz'$ , sensitivity of 7 uV/mm, display speed of 10m/sec with a '60Hz'$  notched filter applied as appropriate. EEG data were recorded continuously and digitally stored.  Video monitoring was available and reviewed as appropriate.  Description: The posterior dominant rhythm consists of 9-10 Hz activity of moderate voltage (25-35 uV) seen predominantly in posterior head regions, symmetric and reactive to eye opening and eye closing. Sleep was characterized by vertex waves, sleep spindles (12 to 14 Hz), maximal frontocentral region. Physiologic photic driving was not seen during photic stimulation. Hyperventilation was not performed.     IMPRESSION: This study is within normal limits. No seizures or epileptiform discharges were seen throughout the recording.  Cecil Vandyke OBarbra Sarks

## 2022-02-02 NOTE — Progress Notes (Signed)
PROGRESS NOTE    Sonya Weber  NWG:[id number] DOB: December 23, 1960 DOA: 02/01/2022 PCP: [name], DO    Chief Complaint  Patient presents with   Loss of Consciousness    Brief Narrative: HPI per Dr. [name] is a 62 y.o. female with medical history significant of iron deficiency anemia, fibroids, hypertension, hypothyroidism, OSA, vitamin D deficiency who presented to the emergency department with a history of having 3 syncopal episodes in the last 3 months most recent 1 was yesterday while she was at the store yesterday around 1100.  The patient stated that she gets pain on her left sided neck associated with diaphoresis, nausea and dizziness followed by LOC.  In the last 3 months, she has had 2 other episodes like this, but without LOC.  They have not happened in the morning.  She usually takes her blood pressure medications around 0700.  No chest pain, palpitations, PND, orthopnea or pitting edema of the lower extremities.  No fever, chills or night sweats. No sore throat, rhinorrhea, dyspnea, wheezing or hemoptysis.  No appetite changes, abdominal pain, diarrhea, constipation, melena or hematochezia.  No flank pain, dysuria, frequency or hematuria.  No polyuria, polydipsia, polyphagia or blurred vision.   Lab work: CBC 0 white count 5.9, hemoglobin 12.1 g/dL platelets 201.  Troponin x 2 normal.  TSH unremarkable.  BMP showed normal electrolytes with a glucose of 108, BUN 21 and creatinine 1.04 mg/dL.   Imaging: CTA head and neck with no evidence of acute intracranial abnormality there was a small 9 mm calcified lesion along the high right frontal convexity without significant mass effect, likely meningioma.  No emergent large vessel occlusion.  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  There is heterogeneous and enlarged thyroid gland with multiple nodules with recommendation to obtain thyroid ultrasound.   ED course: Initial vital signs were temperature 99  F, pulse 75, respiration 18, BP 127/85 mmHg O2 sat 95% on room air.  Assessment & Plan:   Principal Problem:   Syncope and collapse Active Problems:   Benign essential HTN   OSA (obstructive sleep apnea)   Vitamin D deficiency   Thyroid nodule   #1 syncope and collapse -Patient had presented with a syncopal episode with some associated nausea dizziness with some left-sided neck pain followed by LOC.?  Etiology. -CT angiogram head done negative for any large vessel occlusion, no evidence of acute intracranial abnormality, small 9 mm calcified lesion along the high right frontal convexity without significant mass effect likely meningioma. -MRI brain done negative for any acute abnormalities. -EEG ordered and pending. -2D echo ordered and pending. -Orthostatics done on admission were negative. -No arrhythmias noted on telemetry. -Patient however on admission noted to have soft blood pressure. -Antihypertensive medications held during the hospitalization. -Patient currently improving clinically with no further episodes. -If workup is negative may need outpatient event monitor for further evaluation outpatient follow-up with PCP and cardiology.  2.  Hypertension -Continue to hold antihypertensive medications. -BP stable.  3.  Thyroid nodules -Thyroid ultrasound obtained with nodule measuring 1.3 x 0.8 x 1.0 cm in the mid left thyroid lobe not significantly changed in size since 07/23/2020 still meets criteria for imaging follow-up.  Annual ultrasound surveillance recommended until 5 years of stability is documented. -Continue home regimen Synthroid. -Outpatient follow-up with PCP.  4.  OSA -Not currently on CPAP. -Outpatient follow-up.  5.  Vitamin D deficiency -Outpatient follow-up.    DVT prophylaxis: Lovenox  Code Status: Full Family Communication: Updated patient.  No family at bedside. Disposition: Likely home once clinically improved and workup completed.  Status is:  Observation The patient remains OBS appropriate and will d/c before 2 midnights.   Consultants:  None  Procedures:  EEG pending 2D echo pending MRI brain 02/01/2022 CT angiogram head and neck 02/01/2022 Thyroid ultrasound 02/01/2022   Antimicrobials:  None   Subjective: Patient laying in bed.  Alert oriented to self place and time.  Denies any chest pain.  No shortness of breath.  No abdominal pain.  No further syncopal episodes.  Overall feels well.  Objective: Vitals:   02/01/22 2001 02/02/22 0107 02/02/22 0546 02/02/22 1316  BP: 123/79 100/65 113/74 112/74  Pulse: 78 64 61 74  Resp: '18 18 17 16  '$ Temp: (!) 97.3 F (36.3 C) (!) 97.5 F (36.4 C) 97.8 F (36.6 C) 98 F (36.7 C)  TempSrc: Oral Oral Oral Oral  SpO2: 97% 99% 99% 100%  Weight:      Height:        Intake/Output Summary (Last 24 hours) at 02/02/2022 1630 Last data filed at 02/02/2022 0400 Gross per 24 hour  Intake 379.21 ml  Output --  Net 379.21 ml   Filed Weights   02/01/22 0833  Weight: 77.6 kg    Examination:  General exam: Appears calm and comfortable  Respiratory system: Clear to auscultation. Respiratory effort normal. Cardiovascular system: S1 & S2 heard, RRR. No JVD, murmurs, rubs, gallops or clicks. No pedal edema. Gastrointestinal system: Abdomen is nondistended, soft and nontender. No organomegaly or masses felt. Normal bowel sounds heard. Central nervous system: Alert and oriented. No focal neurological deficits. Extremities: Symmetric 5 x 5 power. Skin: No rashes, lesions or ulcers Psychiatry: Judgement and insight appear normal. Mood & affect appropriate.     Data Reviewed: I have personally reviewed following labs and imaging studies  CBC: Recent Labs  Lab 02/01/22 1002 02/02/22 0601  WBC 5.9 5.8  HGB 12.1 11.3*  HCT 37.8 36.7  MCV 82.7 85.7  PLT 201 222    Basic Metabolic Panel: Recent Labs  Lab 02/01/22 1002 02/02/22 0601  NA 137 137  K 3.6 3.7  CL 101 101   CO2 29 27  GLUCOSE 108* 97  BUN 21* 21*  CREATININE 1.04* 1.09*  CALCIUM 9.3 8.9  MG  --  2.1    GFR: Estimated Creatinine Clearance: 59.7 mL/min (A) (by C-G formula based on SCr of 1.09 mg/dL (H)).  Liver Function Tests: Recent Labs  Lab 02/02/22 0601  AST 17  ALT 16  ALKPHOS 57  BILITOT 0.7  PROT 7.9  ALBUMIN 3.8    CBG: Recent Labs  Lab 02/02/22 0545  GLUCAP 109*     No results found for this or any previous visit (from the past 240 hour(s)).       Radiology Studies: ECHOCARDIOGRAM COMPLETE  Result Date: 02/02/2022    ECHOCARDIOGRAM REPORT   Patient Name:   Sonya Weber [facility] Date of Exam: 02/02/2022 Medical Rec #:  [id number]     Height:       67.0 in Accession #:    [id number]    Weight:       171.0 lb Date of Birth:  [birth year]     BSA:          1.892 m Patient Age:    66 years      BP:           113/74  mmHg Patient Gender: F             HR:           71 bpm. Exam Location:  Inpatient Procedure: 2D Echo, Color Doppler and Cardiac Doppler Indications:    R55 Syncope  History:        Patient has no prior history of Echocardiogram examinations.                 Risk Factors:Hypertension and Sleep Apnea.  Sonographer:    Raquel Sarna Senior RDCS Referring Phys: [id number] [place]  1. Moderate septal hypertrophy with otherwise mild concentric LVH.Marland Kitchen Left ventricular ejection fraction, by estimation, is 65 to 70%. The left ventricle has normal function. The left ventricle has no regional wall motion abnormalities. There is moderate asymmetric left ventricular hypertrophy of the septal segment. Left ventricular diastolic parameters are consistent with Grade I diastolic dysfunction (impaired relaxation).  2. Right ventricular systolic function is normal. The right ventricular size is normal. There is normal pulmonary artery systolic pressure.  3. The mitral valve is normal in structure. Trivial mitral valve regurgitation. No evidence of mitral stenosis.  4. The aortic  valve is tricuspid. Aortic valve regurgitation is not visualized. No aortic stenosis is present.  5. The inferior vena cava is normal in size with greater than 50% respiratory variability, suggesting right atrial pressure of 3 mmHg. FINDINGS  Left Ventricle: Moderate septal hypertrophy with otherwise mild concentric LVH. Left ventricular ejection fraction, by estimation, is 65 to 70%. The left ventricle has normal function. The left ventricle has no regional wall motion abnormalities. The left ventricular internal cavity size was normal in size. There is moderate asymmetric left ventricular hypertrophy of the septal segment. Left ventricular diastolic parameters are consistent with Grade I diastolic dysfunction (impaired relaxation). Right Ventricle: The right ventricular size is normal. No increase in right ventricular wall thickness. Right ventricular systolic function is normal. There is normal pulmonary artery systolic pressure. The tricuspid regurgitant velocity is 2.38 m/s, and  with an assumed right atrial pressure of 3 mmHg, the estimated right ventricular systolic pressure is 31.5 mmHg. Left Atrium: Left atrial size was normal in size. Right Atrium: Right atrial size was normal in size. Pericardium: There is no evidence of pericardial effusion. Mitral Valve: The mitral valve is normal in structure. Trivial mitral valve regurgitation. No evidence of mitral valve stenosis. Tricuspid Valve: The tricuspid valve is normal in structure. Tricuspid valve regurgitation is trivial. No evidence of tricuspid stenosis. Aortic Valve: The aortic valve is tricuspid. Aortic valve regurgitation is not visualized. No aortic stenosis is present. Pulmonic Valve: The pulmonic valve was normal in structure. Pulmonic valve regurgitation is trivial. No evidence of pulmonic stenosis. Aorta: The aortic root is normal in size and structure. Venous: The inferior vena cava is normal in size with greater than 50% respiratory variability,  suggesting right atrial pressure of 3 mmHg. IAS/Shunts: No atrial level shunt detected by color flow Doppler.  LEFT VENTRICLE PLAX 2D LVIDd:         3.10 cm   Diastology LVIDs:         2.00 cm   LV e' medial:    5.55 cm/s LV PW:         1.20 cm   LV E/e' medial:  17.4 LV IVS:        1.33 cm   LV e' lateral:   6.85 cm/s LVOT diam:     2.00 cm   LV  E/e' lateral: 14.1 LV SV:         59 LV SV Index:   31 LVOT Area:     3.14 cm  RIGHT VENTRICLE RV S prime:     9.25 cm/s TAPSE (M-mode): 2.1 cm LEFT ATRIUM             Index        RIGHT ATRIUM           Index LA diam:        2.90 cm 1.53 cm/m   RA Area:     11.60 cm LA Vol (A2C):   44.5 ml 23.52 ml/m  RA Volume:   22.30 ml  11.79 ml/m LA Vol (A4C):   41.7 ml 22.04 ml/m LA Biplane Vol: 43.2 ml 22.83 ml/m  AORTIC VALVE LVOT Vmax:   90.10 cm/s LVOT Vmean:  63.500 cm/s LVOT VTI:    0.187 m  AORTA Ao Root diam: 3.00 cm Ao Asc diam:  3.00 cm MITRAL VALVE                TRICUSPID VALVE MV Area (PHT): 2.31 cm     TR Peak grad:   22.7 mmHg MV Decel Time: 328 msec     TR Vmax:        238.00 cm/s MV E velocity: 96.80 cm/s MV A velocity: 111.00 cm/s  SHUNTS MV E/A ratio:  0.87         Systemic VTI:  0.19 m                             Systemic Diam: 2.00 cm Skeet Latch MD Electronically signed by Skeet Latch MD Signature Date/Time: 02/02/2022/4:09:50 PM    Final    US THYROID  Result Date: 02/02/2022 CLINICAL DATA:  Thyroid nodule EXAM: THYROID ULTRASOUND TECHNIQUE: Ultrasound examination of the thyroid gland and adjacent soft tissues was performed. COMPARISON:  07/23/2020 FINDINGS: Parenchymal Echotexture: Mildly heterogenous Isthmus: 1.3 cm Right lobe: 7.3 x 2.7 x 2.6 cm Left lobe: 5.3 x 2.5 x 2.5 cm _________________________________________________________ Estimated total number of nodules >/= 1 cm: 5 Number of spongiform nodules >/=  2 cm not described below (TR1): 0 Number of mixed cystic and solid nodules >/= 1.5 cm not described below ([place]): 0  _________________________________________________________ Nodule 1: 1.5 x 0.8 x 1.2 cm isoechoic region in the superior isthmus is favored to be a pseudo nodule given lack of defined margins. _________________________________________________________ Nodule 3: 1.3 x 0.7 x 1.2 cm predominantly cystic right mid thyroid nodule does not meet criteria for imaging surveillance or FNA. _________________________________________________________ Nodule 5: 1.4 x 1.1 x 1.2 cm solid isoechoic left superior thyroid nodule does not meet criteria for FNA or imaging follow-up. _________________________________________________________ Nodule # 6: Prior biopsy: No Location: Left; mid Maximum size: 1.3 cm; Other 2 dimensions: 0.8 x 1.0 cm, previously, 1.0 x 0.9 x 0.8 cm on 07/23/2020 Composition: solid/almost completely solid (2) Echogenicity: hypoechoic (2) Shape: not taller-than-wide (0) Margins: ill-defined (0) Echogenic foci: none (0) ACR TI-RADS total points: 4. ACR TI-RADS risk category: TR4 (4-6 points). ACR TI-RADS recommendations: *Given size (>/= 1 - 1.4 cm) and appearance, a follow-up ultrasound in 1 year should be considered based on TI-RADS criteria. _________________________________________________________ Nodule 7: 1.5 x 0.8 x 1.1 cm spongiform nodule in the mid left thyroid lobe does not meet criteria for imaging surveillance or FNA. _________________________________________________________ Remaining bilateral subcentimeter thyroid nodules do not meet criteria for imaging  surveillance or FNA. IMPRESSION: Nodule 6 (TI-RADS 4), measuring 1.3 x 0.8 x 1.0 cm, located in the mid left thyroid lobe is not significantly changed in size since 07/23/2020 and still meets criteria for imaging follow-up. Annual ultrasound surveillance is recommended until 5 years of stability is documented. The above is in keeping with the ACR TI-RADS recommendations - J Am Coll Radiol 2017;14:587-595. Electronically Signed   By: Miachel Roux M.D.    On: 02/02/2022 08:14   MR BRAIN WO CONTRAST  Result Date: 02/01/2022 CLINICAL DATA:  Syncope EXAM: MRI HEAD WITHOUT CONTRAST TECHNIQUE: Multiplanar, multiecho pulse sequences of the brain and surrounding structures were obtained without intravenous contrast. COMPARISON:  None Available. FINDINGS: Brain: No acute infarct, mass effect or extra-axial collection. No chronic microhemorrhage or siderosis. Minimal multifocal hyperintense T2-weight signal within the white matter. The midline structures are normal. Vascular: Normal flow voids. Skull and upper cervical spine: Normal marrow signal. Sinuses/Orbits: Negative. Other: None. IMPRESSION: 1. No acute intracranial abnormality. 2. Minimal multifocal hyperintense T2-weight signal within the white matter, nonspecific but most commonly seen in the setting of chronic microvascular ischemia. Electronically Signed   By: Ulyses Jarred M.D.   On: 02/01/2022 23:37   DG Chest Port 1 View  Result Date: 02/01/2022 CLINICAL DATA:  Recent syncopal episode EXAM: PORTABLE CHEST 1 VIEW COMPARISON:  None Available. FINDINGS: The heart size and mediastinal contours are within normal limits. Both lungs are clear. The visualized skeletal structures are unremarkable. IMPRESSION: No active disease. Electronically Signed   By: Inez Catalina M.D.   On: 02/01/2022 19:28   CT ANGIO HEAD NECK W WO CM  Result Date: 02/01/2022 CLINICAL DATA:  Stroke/TIA, determine embolic source EXAM: CT ANGIOGRAPHY HEAD AND NECK TECHNIQUE: Multidetector CT imaging of the head and neck was performed using the standard protocol during bolus administration of intravenous contrast. Multiplanar CT image reconstructions and MIPs were obtained to evaluate the vascular anatomy. Carotid stenosis measurements (when applicable) are obtained utilizing NASCET criteria, using the distal internal carotid diameter as the denominator. RADIATION DOSE REDUCTION: This exam was performed according to the departmental  dose-optimization program which includes automated exposure control, adjustment of the mA and/or kV according to patient size and/or use of iterative reconstruction technique. CONTRAST:  129m OMNIPAQUE IOHEXOL 350 MG/ML SOLN COMPARISON:  None Available. FINDINGS: CT HEAD FINDINGS Brain: No evidence of acute infarction, hemorrhage, hydrocephalus, extra-axial collection or intraparenchymal mass lesion/mass effect. Small (9 mm) calcified lesion along the high right frontal convexity without significant mass effect, likely a meningioma. Vascular: See below. Skull: No acute fracture. Sinuses/Orbits: Clear.  No acute orbital findings. Other: No mastoid effusions. Review of the MIP images confirms the above findings CTA NECK FINDINGS Aortic arch: Great vessel origins are patent without significant stenosis. 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 Left carotid system: Atherosclerosis at the carotid bifurcation without greater than 50% stenosis. Vertebral arteries: Co dominant. Both vertebral arteries are patent without significant (greater than 50%) stenosis. Skeleton: No acute findings on limited assessment. Other neck: Heterogeneous enlarged thyroid gland with multiple nodules. Upper chest: Visualized lung apices are clear. Review of the MIP images confirms the above findings CTA HEAD FINDINGS Anterior circulation: Bilateral intracranial ICAs are patent with mild atherosclerosis. Bilateral MCAs and ACAs are patent. 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 Posterior circulation: Bilateral intradural vertebral arteries no basilar artery and bilateral  posterior cerebral arteries are patent without proximal hemodynamically significant stenosis. Venous sinuses: As permitted by contrast timing, patent. Review of the MIP images confirms the above findings  IMPRESSION: CT head: 1. No evidence of acute intracranial abnormality. 2. Small (9 mm) calcified lesion along the high right frontal convexity without significant mass effect, likely a meningioma. CTA: 1. No emergent large vessel occlusion. 2. 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 3. Heterogeneous and enlarged thyroid gland with multiple nodules. Recommend thyroid US (ref: J Am Coll Radiol. 2015 Feb;12(2): 143-50). Electronically Signed   By: Margaretha Sheffield M.D.   On: 02/01/2022 10:58        Scheduled Meds:  enoxaparin (LOVENOX) injection  40 mg Subcutaneous Q24H   famotidine  20 mg Oral BID   levothyroxine  50 mcg Oral Q0600   sodium chloride flush  3 mL Intravenous Q12H   Continuous Infusions:   LOS: 0 days    Time spent: 35 minutes    Irine Seal, MD [facility]   To contact the attending provider between 7A-7P or the covering provider during after hours 7P-7A, please log into the web site [email or web address] and access using universal [place] password for that web site. If you do not have the password, please call the hospital operator.  02/02/2022, 4:30 PM

## 2022-02-02 NOTE — Progress Notes (Signed)
Echocardiogram 2D Echocardiogram has been performed.  Oneal Deputy Drema Eddington RDCS 02/02/2022, 3:04 PM

## 2022-02-02 NOTE — Progress Notes (Signed)
Echo attempted at 1:40, EEG just beginning. Will re-attempt later as schedule permits.  [place]

## 2022-02-02 NOTE — Progress Notes (Signed)
EEG complete - results pending 

## 2022-02-02 NOTE — Plan of Care (Signed)

## 2022-02-03 ENCOUNTER — Other Ambulatory Visit: Payer: Self-pay | Admitting: Student

## 2022-02-03 DIAGNOSIS — E041 Nontoxic single thyroid nodule: Secondary | ICD-10-CM | POA: Diagnosis not present

## 2022-02-03 DIAGNOSIS — R55 Syncope and collapse: Secondary | ICD-10-CM

## 2022-02-03 DIAGNOSIS — G4733 Obstructive sleep apnea (adult) (pediatric): Secondary | ICD-10-CM | POA: Diagnosis not present

## 2022-02-03 DIAGNOSIS — I10 Essential (primary) hypertension: Secondary | ICD-10-CM | POA: Diagnosis not present

## 2022-02-03 HISTORY — PX: BREAST BIOPSY: SHX20

## 2022-02-03 LAB — CBC
HCT: 35.6 % — ABNORMAL LOW (ref 36.0–46.0)
Hemoglobin: 11.2 g/dL — ABNORMAL LOW (ref 12.0–15.0)
MCH: 27 pg (ref 26.0–34.0)
MCHC: 31.5 g/dL (ref 30.0–36.0)
MCV: 85.8 fL (ref 80.0–100.0)
Platelets: 172 10*3/uL (ref 150–400)
RBC: 4.15 MIL/uL (ref 3.87–5.11)
RDW: 15.5 % (ref 11.5–15.5)
WBC: 6.3 10*3/uL (ref 4.0–10.5)
nRBC: 0 % (ref 0.0–0.2)

## 2022-02-03 LAB — BASIC METABOLIC PANEL
Anion gap: 7 (ref 5–15)
BUN: 20 mg/dL (ref 6–20)
CO2: 25 mmol/L (ref 22–32)
Calcium: 8.9 mg/dL (ref 8.9–10.3)
Chloride: 106 mmol/L (ref 98–111)
Creatinine, Ser: 1 mg/dL (ref 0.44–1.00)
GFR, Estimated: >60 mL/min (ref >60)
Glucose, Bld: 105 mg/dL — ABNORMAL HIGH (ref 70–99)
Potassium: 4.1 mmol/L (ref 3.5–5.1)
Sodium: 138 mmol/L (ref 135–145)

## 2022-02-03 LAB — MAGNESIUM: Magnesium: 2.4 mg/dL (ref 1.7–2.4)

## 2022-02-03 LAB — GLUCOSE, CAPILLARY: Glucose-Capillary: 103 mg/dL — ABNORMAL HIGH (ref 70–99)

## 2022-02-03 MED ORDER — OLMESARTAN MEDOXOMIL 20 MG PO TABS: 20.0000 mg | ORAL_TABLET | Freq: Every day | ORAL | 1 refills | Status: DC

## 2022-02-03 NOTE — Progress Notes (Signed)
Ordered 30 day event monitor for further evaluation of syncope at the request of Dr. Grandville Silos (Internal Medicine). Patient has never been seen by our office. Therefore, also arranged a New Patient Visit with Dr. [place] on 03/30/2022 to review monitor results and follow-up.  Darreld Mclean, PA-C 02/03/2022 8:39 AM

## 2022-02-03 NOTE — Progress Notes (Signed)
  Transition of Care [facility]) Screening Note   Patient Details  Name: Sonya Weber Date of Birth: 05/04/1960   Transition of Care [facility]) CM/SW Contact:    Vassie Moselle, LCSW Phone Number: 02/03/2022, 9:20 AM    Transition of Care Department [facility]) has reviewed patient and no TOC needs have been identified at this time. We will continue to monitor patient advancement through interdisciplinary progression rounds. If new patient transition needs arise, please place a TOC consult.

## 2022-02-03 NOTE — Discharge Summary (Signed)
Physician Discharge Summary  Sonya Weber FMB:[id number] DOB: 1960/08/03 DOA: 02/01/2022  PCP: [name], DO  Admit date: 02/01/2022 Discharge date: 02/03/2022  Time spent: 55 minutes  Recommendations for Outpatient Follow-up:  Follow-up with [name] cardiology on 03/30/2022. Follow-up with [name] B, DO as scheduled within the next 2 weeks.  On follow-up patient's blood pressure need to be reassessed as patient's Benicar HCTZ was discontinued and patient placed on Benicar 20 mg daily.  Patient will need a basic metabolic profile done to follow-up on electrolytes and renal function.  Further evaluation of patient's syncopal episode will need to be followed up upon.  Patient being discharged with an event monitor.  Patient also needs follow-up on thyroid nodules.   Discharge Diagnoses:  Principal Problem:   Syncope and collapse Active Problems:   Benign essential HTN   OSA (obstructive sleep apnea)   Vitamin D deficiency   Thyroid nodule   Discharge Condition: Stable and improved.  Diet recommendation: Heart healthy  Filed Weights   02/01/22 0833  Weight: 77.6 kg    History of present illness:  HPI per Dr. Roanna Weber is a 62 y.o. female with medical history significant of iron deficiency anemia, fibroids, hypertension, hypothyroidism, OSA, vitamin D deficiency who presented to the emergency department with a history of having 3 syncopal episodes in the last 3 months most recent 1 was yesterday while she was at the store yesterday around 1100.  The patient stated that she gets pain on her left sided neck associated with diaphoresis, nausea and dizziness followed by LOC.  In the last 3 months, she has had 2 other episodes like this, but without LOC.  They have not happened in the morning.  She usually takes her blood pressure medications around 0700.  No chest pain, palpitations, PND, orthopnea or pitting edema of the lower extremities.  No fever, chills  or night sweats. No sore throat, rhinorrhea, dyspnea, wheezing or hemoptysis.  No appetite changes, abdominal pain, diarrhea, constipation, melena or hematochezia.  No flank pain, dysuria, frequency or hematuria.  No polyuria, polydipsia, polyphagia or blurred vision.   Lab work: CBC 0 white count 5.9, hemoglobin 12.1 g/dL platelets 201.  Troponin x 2 normal.  TSH unremarkable.  BMP showed normal electrolytes with a glucose of 108, BUN 21 and creatinine 1.04 mg/dL.   Imaging: CTA head and neck with no evidence of acute intracranial abnormality there was a small 9 mm calcified lesion along the high right frontal convexity without significant mass effect, likely meningioma.  No emergent large vessel occlusion.  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  There is heterogeneous and enlarged thyroid gland with multiple nodules with recommendation to obtain thyroid ultrasound.   ED course: Initial vital signs were temperature 99 F, pulse 75, respiration 18, BP 127/85 mmHg O2 sat 95% on room air.  Hospital Course:  #1 syncope and collapse -Patient had presented with a syncopal episode with some associated nausea dizziness with some left-sided neck pain followed by LOC.?  Etiology.?  Vasovagal -CT angiogram head done negative for any large vessel occlusion, no evidence of acute intracranial abnormality, small 9 mm calcified lesion along the high right frontal convexity without significant mass effect likely meningioma. -MRI brain done negative for any acute abnormalities. -EEG ordered and done was within normal limits, no seizures or epileptiform discharges seen throughout the recording. -2D echo ordered and done had a EF of 60 to 65%, NWMA,  moderate asymmetric left ventricular hypertrophy of septal segment, grade 1 diastolic dysfunction, normal right ventricular systolic function, right ventricular size is normal, normal pulmonary artery systolic pressure.  No evidence of mitral stenosis.   No evidence of aortic stenosis noted. -Orthostatics done on admission were negative. -No arrhythmias noted on telemetry. -Patient however on admission noted to have soft blood pressure. -Antihypertensive medications held during the hospitalization. -Patient improved clinically with hydration, had no further episodes will be discharged home back on home regimen of Coreg, patient's Benicar HCTZ discontinued and patient be placed on Benicar 20 mg daily with outpatient follow-up with PCP.   -Patient will be set up with an event monitor which will be mailed for further evaluation of syncopal episode and will follow-up with cardiology Dr. Martinique in the outpatient setting and to establish care.   -Patient will be discharged in stable and improved condition.     2.  Hypertension -Patient's blood pressure initially on admission noted to be soft and as such patient antihypertensive medications held.   -Patient hydrated with IV fluids with improvement with soft blood pressure.   -Patient will be discharged home on home regimen of Coreg, home regimen of Benicar HCTZ discontinued and patient will be placed on Benicar 20 mg daily for blood pressure control.   -Outpatient follow-up with PCP as scheduled.   3.  Thyroid nodules -Thyroid ultrasound obtained with nodule measuring 1.3 x 0.8 x 1.0 cm in the mid left thyroid lobe not significantly changed in size since 07/23/2020 still meets criteria for imaging follow-up.  Annual ultrasound surveillance recommended until 5 years of stability is documented. -Patient maintained on home regimen Synthroid.   -Outpatient follow-up with PCP.     4.  OSA -Not currently on CPAP. -Outpatient follow-up.   5.  Vitamin D deficiency -Outpatient follow-up.  Procedures: EEG 02/02/2022 2D echo 02/02/2022 MRI brain 02/01/2022 CT angiogram head and neck 02/01/2022 Thyroid ultrasound 02/01/2022    Consultations: None  Discharge Exam: Vitals:   02/03/22 0407 02/03/22 0858   BP: 134/81 (!) 136/96  Pulse: 76 80  Resp: 17 18  Temp: 97.8 F (36.6 C) 98.2 F (36.8 C)  SpO2: 100% 100%    General: NAD Cardiovascular: RRR no murmurs rubs or gallops.  No JVD.  No lower extremity edema. Respiratory: Clear to auscultation bilaterally.  No wheezes, no crackles, no rhonchi.  Fair air movement.  Speaking in full sentences.  Discharge Instructions   Discharge Instructions     Diet - low sodium heart healthy   Complete by: As directed    Increase activity slowly   Complete by: As directed       Allergies as of 02/03/2022   No Known Allergies      Medication List     STOP taking these medications    olmesartan-hydrochlorothiazide 40-25 MG tablet Commonly known as: BENICAR HCT       TAKE these medications    carvedilol 25 MG tablet Commonly known as: COREG Take 25 mg by mouth in the morning.   ipratropium 0.03 % nasal spray Commonly known as: Atrovent Place 2 sprays into the nose every 12 (twelve) hours.   olmesartan 20 MG tablet Commonly known as: BENICAR Take 1 tablet (20 mg total) by mouth daily.   oxyCODONE 5 MG immediate release tablet Commonly known as: Oxy IR/ROXICODONE Take 1 tablet (5 mg total) by mouth every 6 (six) hours as needed for severe pain.   pantoprazole 40 MG tablet Commonly known as: PROTONIX  Take 40 mg by mouth daily as needed (for reflux or heartburn).   Synthroid 50 MCG tablet Generic drug: levothyroxine Take 50 mcg by mouth daily before breakfast. What changed: Another medication with the same name was removed. Continue taking this medication, and follow the directions you see here.       No Known Allergies  Follow-up Information     Martinique, Peter M, MD Follow up.   Specialty: Cardiology Why: Appointment with Cardiology scheduled for 03/30/2022 at 8:40am to review heart monitor results. If this date/time does not work for you, please call our office to reschedule. Contact information: [address] [place] [address] [phone]         [name] B, DO Follow up.   Specialty: Internal Medicine Why: Follow-up as scheduled in the next 2 weeks. Contact information: [address] [place] [address] [phone]                  The results of significant diagnostics from this hospitalization (including imaging, microbiology, ancillary and laboratory) are listed below for reference.    Significant Diagnostic Studies: ECHOCARDIOGRAM COMPLETE  Result Date: 02/02/2022    ECHOCARDIOGRAM REPORT   Patient Name:   Sonya Weber [facility] Date of Exam: 02/02/2022 Medical Rec #:  [id number]     Height:       67.0 in Accession #:    [id number]    Weight:       171.0 lb Date of Birth:  Mar 29, 1960     BSA:          1.892 m Patient Age:    62 years      BP:           113/74 mmHg Patient Gender: F             HR:           71 bpm. Exam Location:  Inpatient Procedure: 2D Echo, Color Doppler and Cardiac Doppler Indications:    R55 Syncope  History:        Patient has no prior history of Echocardiogram examinations.                 Risk Factors:Hypertension and Sleep Apnea.  Sonographer:    Raquel Sarna Senior RDCS Referring Phys: [id number] [place]  1. Moderate septal hypertrophy with otherwise mild concentric LVH.Marland Kitchen Left ventricular ejection fraction, by estimation, is 65 to 70%. The left ventricle has normal function. The left ventricle has no regional wall motion abnormalities. There is moderate asymmetric left ventricular hypertrophy of the septal segment. Left ventricular diastolic parameters are consistent with Grade I diastolic dysfunction (impaired relaxation).  2. Right ventricular systolic function is normal. The right ventricular size is normal. There is normal pulmonary artery systolic pressure.  3. The mitral valve is normal in structure. Trivial mitral valve regurgitation. No evidence of mitral stenosis.  4. The aortic valve is tricuspid.  Aortic valve regurgitation is not visualized. No aortic stenosis is present.  5. The inferior vena cava is normal in size with greater than 50% respiratory variability, suggesting right atrial pressure of 3 mmHg. FINDINGS  Left Ventricle: Moderate septal hypertrophy with otherwise mild concentric LVH. Left ventricular ejection fraction, by estimation, is 65 to 70%. The left ventricle has normal function. The left ventricle has no regional wall motion abnormalities. The left ventricular internal cavity size was normal in size. There is moderate asymmetric left ventricular hypertrophy of the septal  segment. Left ventricular diastolic parameters are consistent with Grade I diastolic dysfunction (impaired relaxation). Right Ventricle: The right ventricular size is normal. No increase in right ventricular wall thickness. Right ventricular systolic function is normal. There is normal pulmonary artery systolic pressure. The tricuspid regurgitant velocity is 2.38 m/s, and  with an assumed right atrial pressure of 3 mmHg, the estimated right ventricular systolic pressure is 16.1 mmHg. Left Atrium: Left atrial size was normal in size. Right Atrium: Right atrial size was normal in size. Pericardium: There is no evidence of pericardial effusion. Mitral Valve: The mitral valve is normal in structure. Trivial mitral valve regurgitation. No evidence of mitral valve stenosis. Tricuspid Valve: The tricuspid valve is normal in structure. Tricuspid valve regurgitation is trivial. No evidence of tricuspid stenosis. Aortic Valve: The aortic valve is tricuspid. Aortic valve regurgitation is not visualized. No aortic stenosis is present. Pulmonic Valve: The pulmonic valve was normal in structure. Pulmonic valve regurgitation is trivial. No evidence of pulmonic stenosis. Aorta: The aortic root is normal in size and structure. Venous: The inferior vena cava is normal in size with greater than 50% respiratory variability, suggesting right  atrial pressure of 3 mmHg. IAS/Shunts: No atrial level shunt detected by color flow Doppler.  LEFT VENTRICLE PLAX 2D LVIDd:         3.10 cm   Diastology LVIDs:         2.00 cm   LV e' medial:    5.55 cm/s LV PW:         1.20 cm   LV E/e' medial:  17.4 LV IVS:        1.33 cm   LV e' lateral:   6.85 cm/s LVOT diam:     2.00 cm   LV E/e' lateral: 14.1 LV SV:         59 LV SV Index:   31 LVOT Area:     3.14 cm  RIGHT VENTRICLE RV S prime:     9.25 cm/s TAPSE (M-mode): 2.1 cm LEFT ATRIUM             Index        RIGHT ATRIUM           Index LA diam:        2.90 cm 1.53 cm/m   RA Area:     11.60 cm LA Vol (A2C):   44.5 ml 23.52 ml/m  RA Volume:   22.30 ml  11.79 ml/m LA Vol (A4C):   41.7 ml 22.04 ml/m LA Biplane Vol: 43.2 ml 22.83 ml/m  AORTIC VALVE LVOT Vmax:   90.10 cm/s LVOT Vmean:  63.500 cm/s LVOT VTI:    0.187 m  AORTA Ao Root diam: 3.00 cm Ao Asc diam:  3.00 cm MITRAL VALVE                TRICUSPID VALVE MV Area (PHT): 2.31 cm     TR Peak grad:   22.7 mmHg MV Decel Time: 328 msec     TR Vmax:        238.00 cm/s MV E velocity: 96.80 cm/s MV A velocity: 111.00 cm/s  SHUNTS MV E/A ratio:  0.87         Systemic VTI:  0.19 m                             Systemic Diam: 2.00 cm Skeet Latch MD Electronically signed by Skeet Latch MD  Signature Date/Time: 02/02/2022/4:09:50 PM    Final    EEG adult  Result Date: 02/02/2022 Lora Havens, MD     02/02/2022  4:47 PM Patient Name: SHEENAH DIMITROFF MRN: [id number] Epilepsy Attending: Lora Havens Referring Physician/Provider: Reubin Milan, MD Date: 02/02/2022 Duration: 21.48 mins Patient history: 62 year old female with syncope. EEG to evaluate for seizure. Level of alertness: Awake, asleep AEDs during EEG study: None Technical aspects: This EEG study was done with scalp electrodes positioned according to the 10-20 International system of electrode placement. Electrical activity was reviewed with band pass filter of 1-'70Hz'$ , sensitivity of 7 uV/mm,  display speed of 68m/sec with a '60Hz'$  notched filter applied as appropriate. EEG data were recorded continuously and digitally stored.  Video monitoring was available and reviewed as appropriate. Description: The posterior dominant rhythm consists of 9-10 Hz activity of moderate voltage (25-35 uV) seen predominantly in posterior head regions, symmetric and reactive to eye opening and eye closing. Sleep was characterized by vertex waves, sleep spindles (12 to 14 Hz), maximal frontocentral region. Physiologic photic driving was not seen during photic stimulation. Hyperventilation was not performed.   IMPRESSION: This study is within normal limits. No seizures or epileptiform discharges were seen throughout the recording. Priyanka OBarbra Sarks  [place]  Result Date: 02/02/2022 CLINICAL DATA:  Thyroid nodule EXAM: THYROID ULTRASOUND TECHNIQUE: Ultrasound examination of the thyroid gland and adjacent soft tissues was performed. COMPARISON:  07/23/2020 FINDINGS: Parenchymal Echotexture: Mildly heterogenous Isthmus: 1.3 cm Right lobe: 7.3 x 2.7 x 2.6 cm Left lobe: 5.3 x 2.5 x 2.5 cm _________________________________________________________ Estimated total number of nodules >/= 1 cm: 5 Number of spongiform nodules >/=  2 cm not described below (TR1): 0 Number of mixed cystic and solid nodules >/= 1.5 cm not described below (TMarsing: 0 _________________________________________________________ Nodule 1: 1.5 x 0.8 x 1.2 cm isoechoic region in the superior isthmus is favored to be a pseudo nodule given lack of defined margins. _________________________________________________________ Nodule 3: 1.3 x 0.7 x 1.2 cm predominantly cystic right mid thyroid nodule does not meet criteria for imaging surveillance or FNA. _________________________________________________________ Nodule 5: 1.4 x 1.1 x 1.2 cm solid isoechoic left superior thyroid nodule does not meet criteria for FNA or imaging follow-up.  _________________________________________________________ Nodule # 6: Prior biopsy: No Location: Left; mid Maximum size: 1.3 cm; Other 2 dimensions: 0.8 x 1.0 cm, previously, 1.0 x 0.9 x 0.8 cm on 07/23/2020 Composition: solid/almost completely solid (2) Echogenicity: hypoechoic (2) Shape: not taller-than-wide (0) Margins: ill-defined (0) Echogenic foci: none (0) ACR TI-RADS total points: 4. ACR TI-RADS risk category: TR4 (4-6 points). ACR TI-RADS recommendations: *Given size (>/= 1 - 1.4 cm) and appearance, a follow-up ultrasound in 1 year should be considered based on TI-RADS criteria. _________________________________________________________ Nodule 7: 1.5 x 0.8 x 1.1 cm spongiform nodule in the mid left thyroid lobe does not meet criteria for imaging surveillance or FNA. _________________________________________________________ Remaining bilateral subcentimeter thyroid nodules do not meet criteria for imaging surveillance or FNA. IMPRESSION: Nodule 6 (TI-RADS 4), measuring 1.3 x 0.8 x 1.0 cm, located in the mid left thyroid lobe is not significantly changed in size since 07/23/2020 and still meets criteria for imaging follow-up. Annual ultrasound surveillance is recommended until 5 years of stability is documented. The above is in keeping with the ACR TI-RADS recommendations - J Am Coll Radiol 2017;14:587-595. Electronically Signed   By: FMiachel RouxM.D.   On: 02/02/2022 08:14   MR BRAIN WO CONTRAST  Result Date: 02/01/2022  CLINICAL DATA:  Syncope EXAM: MRI HEAD WITHOUT CONTRAST TECHNIQUE: Multiplanar, multiecho pulse sequences of the brain and surrounding structures were obtained without intravenous contrast. COMPARISON:  None Available. FINDINGS: Brain: No acute infarct, mass effect or extra-axial collection. No chronic microhemorrhage or siderosis. Minimal multifocal hyperintense T2-weight signal within the white matter. The midline structures are normal. Vascular: Normal flow voids. Skull and upper  cervical spine: Normal marrow signal. Sinuses/Orbits: Negative. Other: None. IMPRESSION: 1. No acute intracranial abnormality. 2. Minimal multifocal hyperintense T2-weight signal within the white matter, nonspecific but most commonly seen in the setting of chronic microvascular ischemia. Electronically Signed   By: Ulyses Jarred M.D.   On: 02/01/2022 23:37   DG Chest Port 1 View  Result Date: 02/01/2022 CLINICAL DATA:  Recent syncopal episode EXAM: PORTABLE CHEST 1 VIEW COMPARISON:  None Available. FINDINGS: The heart size and mediastinal contours are within normal limits. Both lungs are clear. The visualized skeletal structures are unremarkable. IMPRESSION: No active disease. Electronically Signed   By: Inez Catalina M.D.   On: 02/01/2022 19:28   CT ANGIO HEAD NECK W WO CM  Result Date: 02/01/2022 CLINICAL DATA:  Stroke/TIA, determine embolic source EXAM: CT ANGIOGRAPHY HEAD AND NECK TECHNIQUE: Multidetector CT imaging of the head and neck was performed using the standard protocol during bolus administration of intravenous contrast. Multiplanar CT image reconstructions and MIPs were obtained to evaluate the vascular anatomy. Carotid stenosis measurements (when applicable) are obtained utilizing NASCET criteria, using the distal internal carotid diameter as the denominator. RADIATION DOSE REDUCTION: This exam was performed according to the departmental dose-optimization program which includes automated exposure control, adjustment of the mA and/or kV according to patient size and/or use of iterative reconstruction technique. CONTRAST:  153m OMNIPAQUE IOHEXOL 350 MG/ML SOLN COMPARISON:  None Available. FINDINGS: CT HEAD FINDINGS Brain: No evidence of acute infarction, hemorrhage, hydrocephalus, extra-axial collection or intraparenchymal mass lesion/mass effect. Small (9 mm) calcified lesion along the high right frontal convexity without significant mass effect, likely a meningioma. Vascular: See below. Skull:  No acute fracture. Sinuses/Orbits: Clear.  No acute orbital findings. Other: No mastoid effusions. Review of the MIP images confirms the above findings CTA NECK FINDINGS Aortic arch: Great vessel origins are patent without significant stenosis. 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 Left carotid system: Atherosclerosis at the carotid bifurcation without greater than 50% stenosis. Vertebral arteries: Co dominant. Both vertebral arteries are patent without significant (greater than 50%) stenosis. Skeleton: No acute findings on limited assessment. Other neck: Heterogeneous enlarged thyroid gland with multiple nodules. Upper chest: Visualized lung apices are clear. Review of the MIP images confirms the above findings CTA HEAD FINDINGS Anterior circulation: Bilateral intracranial ICAs are patent with mild atherosclerosis. Bilateral MCAs and ACAs are patent. 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 Posterior circulation: Bilateral intradural vertebral arteries no basilar artery and bilateral posterior cerebral arteries are patent without proximal hemodynamically significant stenosis. Venous sinuses: As permitted by contrast timing, patent. Review of the MIP images confirms the above findings IMPRESSION: CT head: 1. No evidence of acute intracranial abnormality. 2. Small (9 mm) calcified lesion along the high right frontal convexity without significant mass effect, likely a meningioma. CTA: 1. No emergent large vessel occlusion. 2. 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 3. Heterogeneous and enlarged thyroid gland with multiple nodules. Recommend thyroid [place](ref: J Am Coll  Radiol. 2015 Feb;12(2): 143-50). Electronically Signed   By: Margaretha Sheffield M.D.   On: 02/01/2022 10:58    Microbiology: No results found for this or any previous visit  (from the past 240 hour(s)).   Labs: Basic Metabolic Panel: Recent Labs  Lab 02/01/22 1002 02/02/22 0601 02/03/22 0521  NA 137 137 138  K 3.6 3.7 4.1  CL 101 101 106  CO2 '29 27 25  '$ GLUCOSE 108* 97 105*  BUN 21* 21* 20  CREATININE 1.04* 1.09* 1.00  CALCIUM 9.3 8.9 8.9  MG  --  2.1 2.4   Liver Function Tests: Recent Labs  Lab 02/02/22 0601  AST 17  ALT 16  ALKPHOS 57  BILITOT 0.7  PROT 7.9  ALBUMIN 3.8   No results for input(s): "LIPASE", "AMYLASE" in the last 168 hours. No results for input(s): "AMMONIA" in the last 168 hours. CBC: Recent Labs  Lab 02/01/22 1002 02/02/22 0601 02/03/22 0521  WBC 5.9 5.8 6.3  HGB 12.1 11.3* 11.2*  HCT 37.8 36.7 35.6*  MCV 82.7 85.7 85.8  PLT 201 166 172   Cardiac Enzymes: No results for input(s): "CKTOTAL", "CKMB", "CKMBINDEX", "TROPONINI" in the last 168 hours. BNP: BNP (last 3 results) No results for input(s): "BNP" in the last 8760 hours.  ProBNP (last 3 results) No results for input(s): "PROBNP" in the last 8760 hours.  CBG: Recent Labs  Lab 02/02/22 0545 02/03/22 0406  GLUCAP 109* 103*       Signed:  Irine Seal MD.  [facility] 02/03/2022, 12:18 PM

## 2022-02-09 ENCOUNTER — Ambulatory Visit: Payer: Commercial Managed Care - PPO | Attending: Student

## 2022-02-09 DIAGNOSIS — R55 Syncope and collapse: Secondary | ICD-10-CM | POA: Diagnosis not present

## 2022-02-16 ENCOUNTER — Ambulatory Visit
Admission: RE | Admit: 2022-02-16 | Discharge: 2022-02-16 | Disposition: A | Discharge status: Home / Self Care | Payer: Commercial Managed Care - PPO | Source: Ambulatory Visit | Attending: Surgery | Admitting: Surgery

## 2022-02-16 DIAGNOSIS — Z1239 Encounter for other screening for malignant neoplasm of breast: Secondary | ICD-10-CM

## 2022-02-16 MED ORDER — GADOPICLENOL 0.5 MMOL/ML IV SOLN
9.0000 mL | Freq: Once | INTRAVENOUS | Status: AC | PRN
  Administered 2022-02-16: 7.5 mL via INTRAVENOUS

## 2022-02-21 ENCOUNTER — Other Ambulatory Visit: Payer: Self-pay | Admitting: Surgery

## 2022-02-21 DIAGNOSIS — R928 Other abnormal and inconclusive findings on diagnostic imaging of breast: Secondary | ICD-10-CM

## 2022-03-02 ENCOUNTER — Ambulatory Visit
Admission: RE | Admit: 2022-03-02 | Discharge: 2022-03-02 | Disposition: A | Discharge status: Home / Self Care | Payer: Commercial Managed Care - PPO | Source: Ambulatory Visit | Attending: Surgery | Admitting: Surgery

## 2022-03-02 DIAGNOSIS — R928 Other abnormal and inconclusive findings on diagnostic imaging of breast: Secondary | ICD-10-CM

## 2022-03-02 MED ORDER — GADOPICLENOL 0.5 MMOL/ML IV SOLN
8.0000 mL | Freq: Once | INTRAVENOUS | Status: AC | PRN
  Administered 2022-03-02: 8 mL via INTRAVENOUS

## 2022-03-26 NOTE — Progress Notes (Signed)
Cardiology Office Note:    Date:  03/30/2022   ID:  Sonya Weber, DOB March 13, 1960, MRN [id number]  PCP:  [name] [place] Providers Cardiologist:  None     Referring MD: [name], DO   Chief Complaint  Patient presents with   Loss of Consciousness    History of Present Illness:    Sonya Weber is a 62 y.o. female seen at the request of Dr Suzy Bouchard for evaluation of syncope. She has a  medical history significant of iron deficiency anemia, fibroids, hypertension, hypothyroidism, OSA, vitamin D deficiency who was admitted in January  with  syncope. Reports that she had been lightheaded before but never passed. She felt a pulling in neck and felt lightheaded and then passed out. Laboratory evaluation was negative. CT angiogram head done negative for any large vessel occlusion, no evidence of acute intracranial abnormality, small 9 mm calcified lesion along the high right frontal convexity without significant mass effect likely meningioma. MRI brain done negative for any acute abnormalities. EEG ordered and done was within normal limits, no seizures or epileptiform discharges seen throughout the recording. 2D echo ordered and done had a EF of 60 to 65%, NWMA, moderate asymmetric left ventricular hypertrophy of septal segment, grade 1 diastolic dysfunction, normal right ventricular systolic function, right ventricular size is normal, normal pulmonary artery systolic pressure.  No evidence of mitral stenosis.  No evidence of aortic stenosis noted. Orthostatics done on admission were negative.No arrhythmias noted on telemetry. Patient however on admission noted to have soft blood pressure. Antihypertensive medications held. Later started back on Coreg and Benicar. HCTZ was discontinued. Outpatient event monitor was normal.   She states that after that her BP went way up to 160/100 and her PCP resumed her HCT. Since then her BP has been OK. She has no recurrent  dizziness. Now retired from [employer].   Past Medical History:  Diagnosis Date   Anemia    Fibroids    Hypertension    Hypothyroidism     Past Surgical History:  Procedure Laterality Date   BREAST LUMPECTOMY WITH RADIOACTIVE SEED LOCALIZATION Right 09/21/2021   Procedure: RIGHT BREAST LUMPECTOMY WITH RADIOACTIVE SEED LOCALIZATION;  Surgeon: Erroll Luna, MD;  Location: [facility];  Service: General;  Laterality: Right;   CESAREAN SECTION     IR GENERIC HISTORICAL  01/16/2014   IR RADIOLOGIST EVAL & MGMT 01/16/2014 Corrie Mckusick, DO [facility] INTERV RAD   TUBAL LIGATION      Current Medications: Current Meds  Medication Sig   carvedilol (COREG) 25 MG tablet Take 25 mg by mouth in the morning.   olmesartan-hydrochlorothiazide (BENICAR HCT) 40-25 MG tablet Take 1 tablet by mouth daily.   pantoprazole (PROTONIX) 40 MG tablet Take 40 mg by mouth daily as needed (for reflux or heartburn).   rosuvastatin (CRESTOR) 20 MG tablet Take by mouth.   SYNTHROID 50 MCG tablet Take 50 mcg by mouth daily before breakfast.     Allergies:   Patient has no known allergies.   Social History   Socioeconomic History   Marital status: Divorced    Spouse name: Not on file   Number of children: Not on file   Years of education: Not on file   Highest education level: Not on file  Occupational History   Not on file  Tobacco Use   Smoking status: Never   Smokeless tobacco: Not on file  Vaping Use  Vaping Use: Never used  Substance and Sexual Activity   Alcohol use: Yes    Comment: occasional   Drug use: Never   Sexual activity: Not on file  Other Topics Concern   Not on file  Social History Narrative   Not on file   Social Determinants of Health   Financial Resource Strain: Not on file  Food Insecurity: No Food Insecurity (02/01/2022)   Hunger Vital Sign    Worried About Running Out of Food in the Last Year: Never true    Ran Out of Food in the Last Year: Never true   Transportation Needs: No Transportation Needs (02/01/2022)   PRAPARE - Hydrologist (Medical): No    Lack of Transportation (Non-Medical): No  Physical Activity: Not on file  Stress: Not on file  Social Connections: Not on file     Family History: The patient's family history includes Colon cancer in her maternal grandmother; Coronary artery disease in her father; Lung cancer in her mother. There is no history of Breast cancer.  ROS:   Please see the history of present illness.     All other systems reviewed and are negative.  EKGs/Labs/Other Studies Reviewed:    The following studies were reviewed today: Echo 02/02/22: IMPRESSIONS     1. Moderate septal hypertrophy with otherwise mild concentric LVH.Marland Kitchen Left  ventricular ejection fraction, by estimation, is 65 to 70%. The left  ventricle has normal function. The left ventricle has no regional wall  motion abnormalities. There is moderate  asymmetric left ventricular hypertrophy of the septal segment. Left  ventricular diastolic parameters are consistent with Grade I diastolic  dysfunction (impaired relaxation).   2. Right ventricular systolic function is normal. The right ventricular  size is normal. There is normal pulmonary artery systolic pressure.   3. The mitral valve is normal in structure. Trivial mitral valve  regurgitation. No evidence of mitral stenosis.   4. The aortic valve is tricuspid. Aortic valve regurgitation is not  visualized. No aortic stenosis is present.   5. The inferior vena cava is normal in size with greater than 50%  respiratory variability, suggesting right atrial pressure of 3 mmHg.   Event monitor 03/14/22: Study Highlights      Normal sinus rhythm   No arrhythmia, pauses or Afib seen  EKG:  EKG is  ordered today.  The ekg ordered today demonstrates NSR with rate 73, normal. I have personally reviewed and interpreted this study.   Recent Labs: 02/01/2022: TSH  2.791 02/02/2022: ALT 16 02/03/2022: BUN 20; Creatinine, Ser 1.00; Hemoglobin 11.2; Magnesium 2.4; Platelets 172; Potassium 4.1; Sodium 138  Recent Lipid Panel No results found for: "CHOL", "TRIG", "HDL", "CHOLHDL", "VLDL", "LDLCALC", "LDLDIRECT"   Risk Assessment/Calculations:                Physical Exam:    VS:  BP 130/88   Pulse 73   Ht 5\' 7"  (1.702 m)   Wt 177 lb (80.3 kg)   SpO2 96%   BMI 27.72 kg/m     Wt Readings from Last 3 Encounters:  03/30/22 177 lb (80.3 kg)  02/01/22 171 lb (77.6 kg)  09/21/21 174 lb 13.2 oz (79.3 kg)     GEN:  Well nourished, well developed in no acute distress HEENT: Normal NECK: No JVD; No carotid bruits LYMPHATICS: No lymphadenopathy CARDIAC: RRR, no murmurs, rubs, gallops RESPIRATORY:  Clear to auscultation without rales, wheezing or rhonchi  ABDOMEN: Soft, non-tender,  non-distended MUSCULOSKELETAL:  No edema; No deformity  SKIN: Warm and dry NEUROLOGIC:  Alert and oriented x 3 PSYCHIATRIC:  Normal affect   ASSESSMENT:    1. Syncope and collapse   2. Primary hypertension   3. Hypercholesterolemia    PLAN:    In order of problems listed above:  Syncope. Likely vagal mediated. No recurrence. Extensive evaluation to date has been negative. No further work up needed unless recurrent syncope HTN. Now controlled on Coreg and Benicar HCT HLD on crestor.      Can follow up PRN      Medication Adjustments/Labs and Tests Ordered: Current medicines are reviewed at length with the patient today.  Concerns regarding medicines are outlined above.  No orders of the defined types were placed in this encounter.  No orders of the defined types were placed in this encounter.   There are no Patient Instructions on file for this visit.   Signed, Atley Scarboro [place], MD  03/30/2022 9:06 AM    [place]

## 2022-03-30 ENCOUNTER — Ambulatory Visit: Payer: Commercial Managed Care - PPO | Attending: Cardiology | Admitting: Cardiology

## 2022-03-30 ENCOUNTER — Encounter: Payer: Self-pay | Admitting: Cardiology

## 2022-03-30 VITALS — BP 130/88 | HR 73 | Ht 67.0 in | Wt 177.0 lb

## 2022-03-30 DIAGNOSIS — E7800 Pure hypercholesterolemia, unspecified: Secondary | ICD-10-CM | POA: Diagnosis not present

## 2022-03-30 DIAGNOSIS — I10 Essential (primary) hypertension: Secondary | ICD-10-CM | POA: Diagnosis not present

## 2022-03-30 DIAGNOSIS — R55 Syncope and collapse: Secondary | ICD-10-CM

## 2022-07-11 ENCOUNTER — Other Ambulatory Visit: Payer: Self-pay | Admitting: Internal Medicine

## 2022-07-11 DIAGNOSIS — Z1231 Encounter for screening mammogram for malignant neoplasm of breast: Secondary | ICD-10-CM

## 2022-07-26 ENCOUNTER — Ambulatory Visit: Payer: 59

## 2022-08-10 ENCOUNTER — Ambulatory Visit
Admission: RE | Admit: 2022-08-10 | Discharge: 2022-08-10 | Disposition: A | Discharge status: Home / Self Care | Payer: 59 | Source: Ambulatory Visit | Attending: Internal Medicine | Admitting: Internal Medicine

## 2022-08-10 DIAGNOSIS — Z1231 Encounter for screening mammogram for malignant neoplasm of breast: Secondary | ICD-10-CM

## 2023-04-04 IMAGING — MG MM DIGITAL SCREENING BILAT W/ TOMO AND CAD
8 series · 8 of 24 positions shown · non-contrast
Comparison: Previous exam(s).

CLINICAL DATA: Screening.

EXAM:
DIGITAL SCREENING BILATERAL MAMMOGRAM WITH TOMOSYNTHESIS AND CAD
TECHNIQUE: Bilateral screening digital craniocaudal and mediolateral oblique
mammograms were obtained. Bilateral screening digital breast
tomosynthesis was performed. The images were evaluated with
computer-aided detection.

[R CC synth-2D]
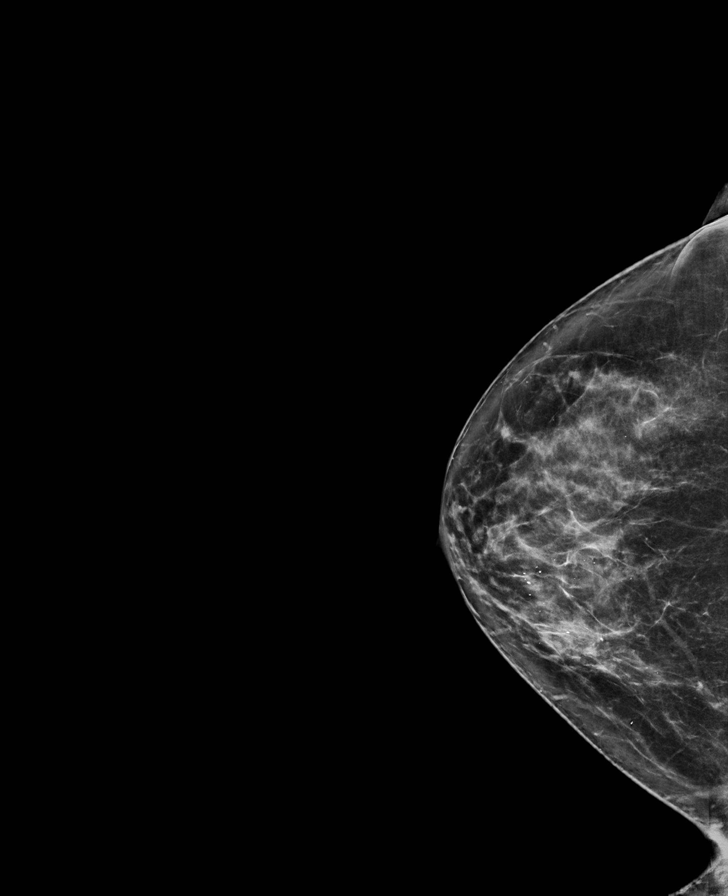

[L CC synth-2D]
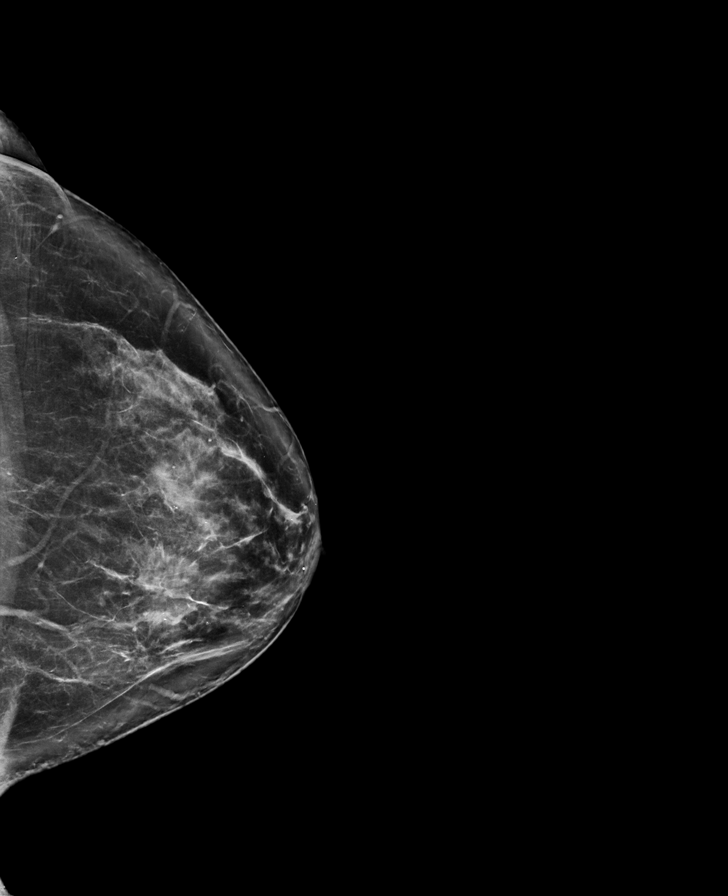

[L MLO synth-2D]
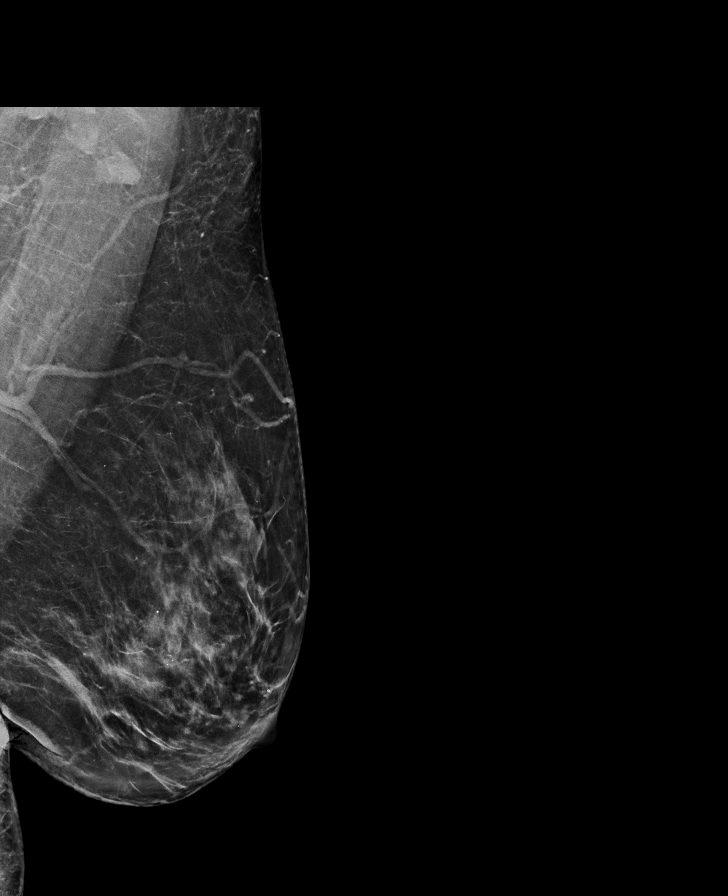

[R MLO synth-2D]
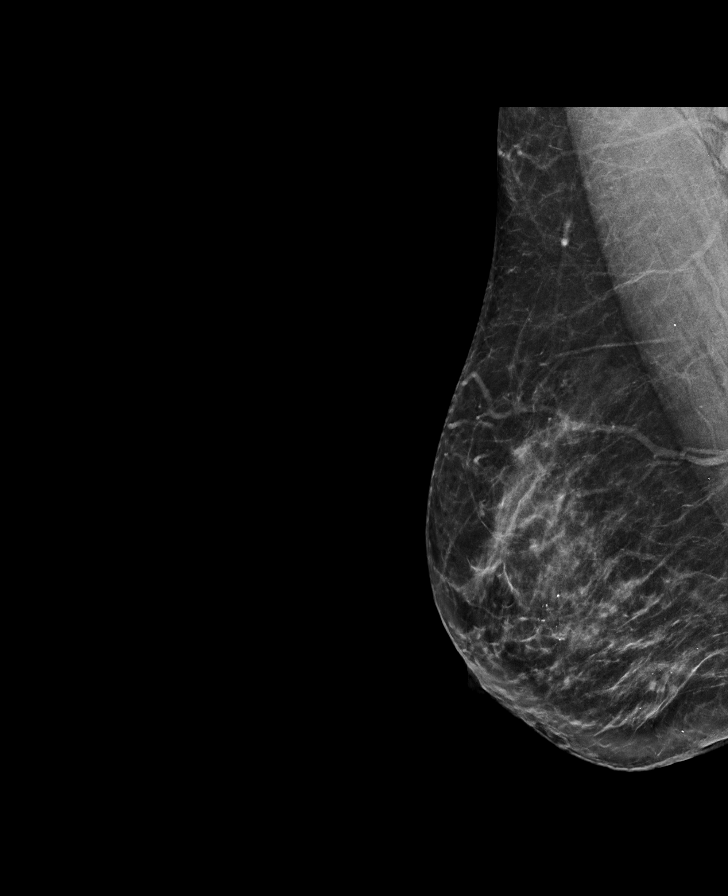

[R CC tomo · tomo slice 38/75.0]
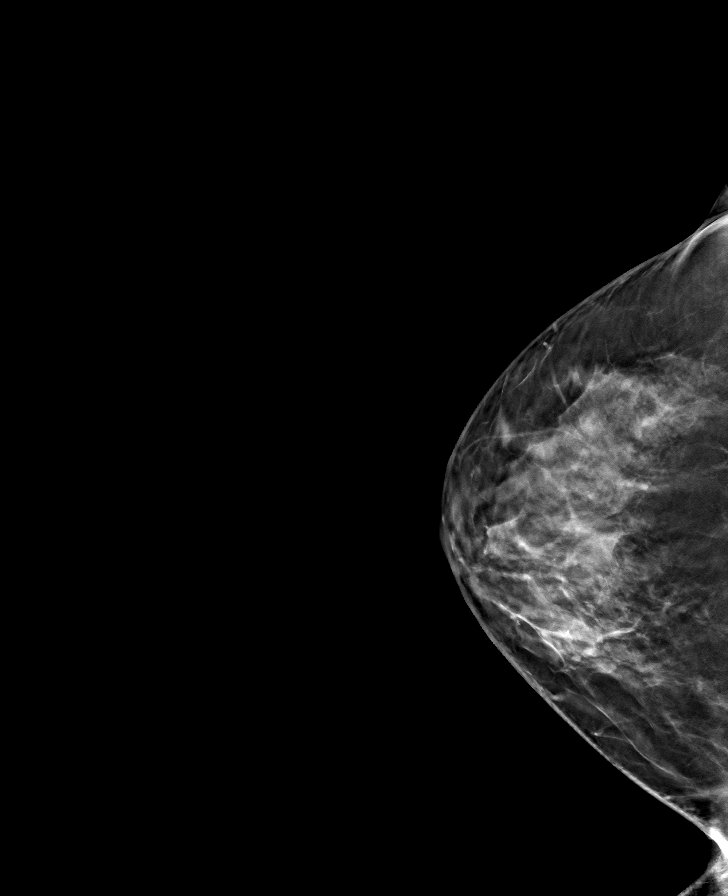

[L MLO tomo · tomo slice 39/77.0]
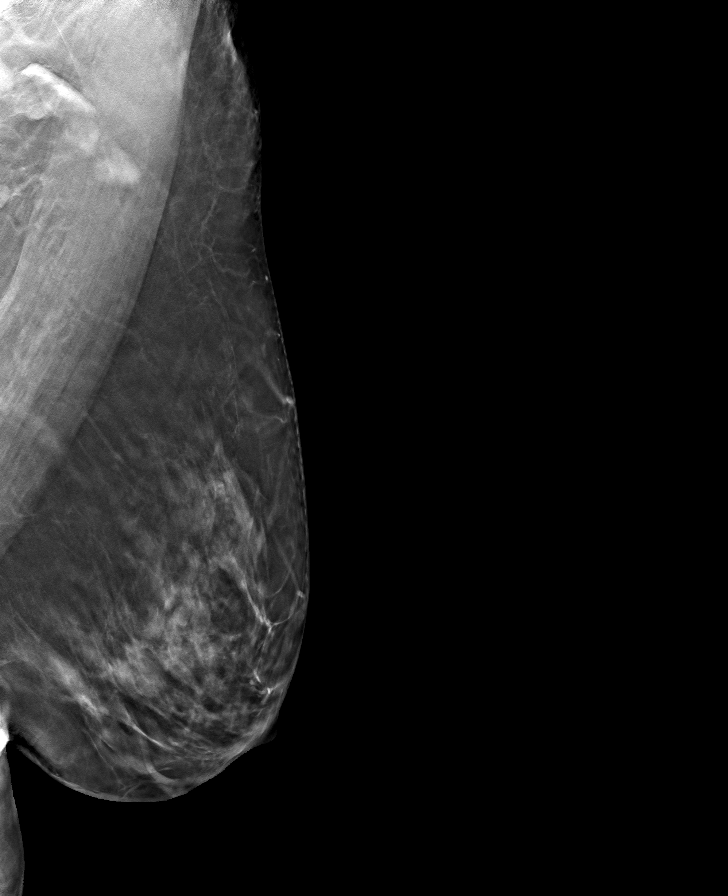

[R MLO tomo · tomo slice 37/74.0]
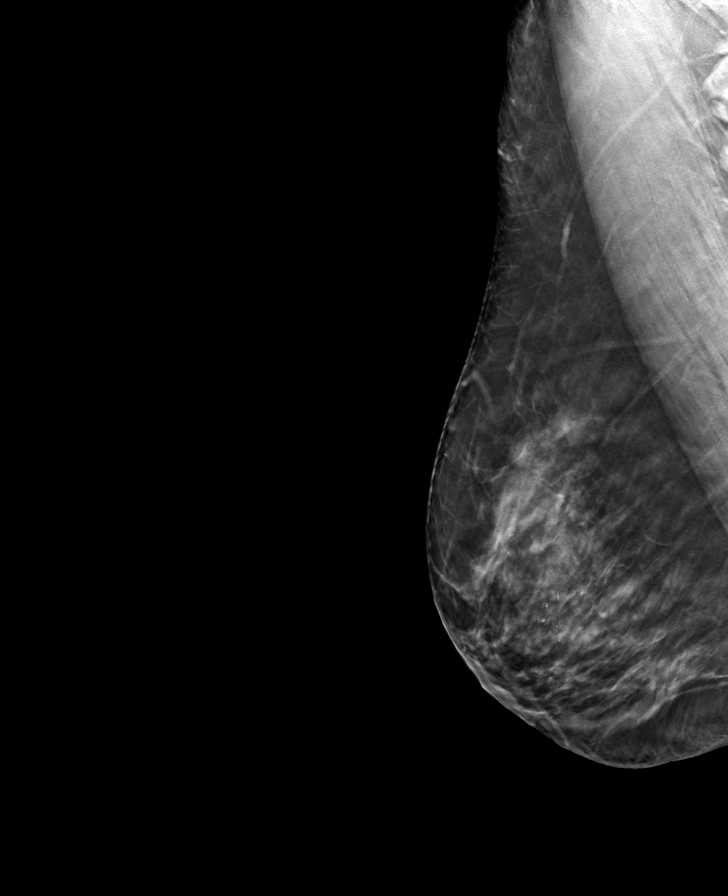

[L CC tomo · tomo slice 41/81.0]
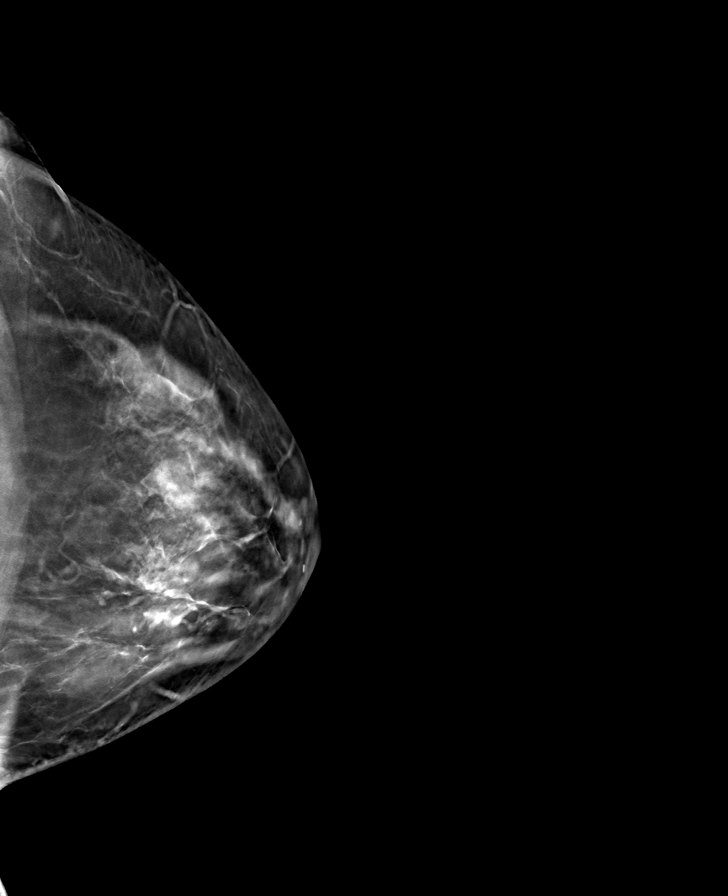

[8 of 24 positions shown; findings below may reference images not displayed]

ACR Breast Density Category c: The breast tissue is heterogeneously
dense, which may obscure small masses.
FINDINGS: There are no findings suspicious for malignancy.
IMPRESSION: No mammographic evidence of malignancy. A result letter of this
screening mammogram will be mailed directly to the patient.

RECOMMENDATION:
Screening mammogram in one year. (Code:Q3-W-BC3)

BI-RADS CATEGORY  1: Negative.

## 2023-07-12 ENCOUNTER — Other Ambulatory Visit: Payer: Self-pay | Admitting: Internal Medicine

## 2023-07-12 DIAGNOSIS — Z1231 Encounter for screening mammogram for malignant neoplasm of breast: Secondary | ICD-10-CM

## 2023-08-11 ENCOUNTER — Ambulatory Visit
Admission: RE | Admit: 2023-08-11 | Discharge: 2023-08-11 | Disposition: A | Discharge status: Home / Self Care | Source: Ambulatory Visit | Attending: Internal Medicine | Admitting: Internal Medicine

## 2023-08-11 DIAGNOSIS — Z1231 Encounter for screening mammogram for malignant neoplasm of breast: Secondary | ICD-10-CM
